# Patient Record
Sex: Male | Born: 1985 | Race: White | Hispanic: No | Marital: Single | Smoking: Never smoker
Health system: Southern US, Community
[De-identification: ages and names within clinical notes are randomized; demographics above are authoritative.]

## PROBLEM LIST (undated history)

## (undated) DIAGNOSIS — L309 Dermatitis, unspecified: Secondary | ICD-10-CM

## (undated) DIAGNOSIS — R000 Tachycardia, unspecified: Secondary | ICD-10-CM

## (undated) DIAGNOSIS — J45909 Unspecified asthma, uncomplicated: Secondary | ICD-10-CM

## (undated) DIAGNOSIS — F32A Depression, unspecified: Secondary | ICD-10-CM

## (undated) DIAGNOSIS — I10 Essential (primary) hypertension: Secondary | ICD-10-CM

## (undated) DIAGNOSIS — T7840XA Allergy, unspecified, initial encounter: Secondary | ICD-10-CM

## (undated) DIAGNOSIS — F840 Autistic disorder: Secondary | ICD-10-CM

## (undated) DIAGNOSIS — Z8619 Personal history of other infectious and parasitic diseases: Secondary | ICD-10-CM

## (undated) DIAGNOSIS — F329 Major depressive disorder, single episode, unspecified: Secondary | ICD-10-CM

## (undated) DIAGNOSIS — Z808 Family history of malignant neoplasm of other organs or systems: Secondary | ICD-10-CM

## (undated) HISTORY — DX: Major depressive disorder, single episode, unspecified: F32.9

## (undated) HISTORY — DX: Allergy, unspecified, initial encounter: T78.40XA

## (undated) HISTORY — PX: EXTERNAL EAR SURGERY: SHX627

## (undated) HISTORY — DX: Unspecified asthma, uncomplicated: J45.909

## (undated) HISTORY — PX: TYMPANOSTOMY TUBE PLACEMENT: SHX32

## (undated) HISTORY — DX: Dermatitis, unspecified: L30.9

## (undated) HISTORY — DX: Essential (primary) hypertension: I10

## (undated) HISTORY — DX: Depression, unspecified: F32.A

## (undated) HISTORY — DX: Personal history of other infectious and parasitic diseases: Z86.19

## (undated) HISTORY — PX: TONSILLECTOMY AND ADENOIDECTOMY: SHX28

---
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
## 2018-01-29 LAB — CBC AND DIFFERENTIAL
HCT: 50 (ref 41–53)
Hemoglobin: 16.7 (ref 13.5–17.5)
Neutrophils Absolute: 5
Platelets: 230 (ref 150–399)
WBC: 7.2

## 2018-01-29 LAB — LIPID PANEL
CHOLESTEROL: 201 — AB (ref 0–200)
HDL: 39 (ref 35–70)
LDL Cholesterol: 148
Triglycerides: 71 (ref 40–160)

## 2018-01-29 LAB — HEPATIC FUNCTION PANEL: ALT: 17 (ref 10–40)

## 2018-01-29 LAB — BASIC METABOLIC PANEL
BUN: 19 (ref 4–21)
Creatinine: 1.3 (ref 0.6–1.3)
Glucose: 85
Potassium: 4 (ref 3.4–5.3)
Sodium: 136 — AB (ref 137–147)

## 2018-01-29 LAB — HIV ANTIBODY (ROUTINE TESTING W REFLEX): HIV 1&2 Ab, 4th Generation: NEGATIVE

## 2018-07-07 LAB — PROTIME-INR: Protime: 21.9 — AB (ref 10.0–13.8)

## 2018-07-08 ENCOUNTER — Telehealth: Payer: Self-pay | Admitting: *Deleted

## 2018-07-08 NOTE — Telephone Encounter (Signed)
Pt is scheduled//ELEA  

## 2018-07-08 NOTE — Telephone Encounter (Signed)
Please schedule as an establish care visit. Thanks.

## 2018-07-08 NOTE — Telephone Encounter (Signed)
Please below and advise  Copied from [name] #[id number]. Topic: Appointment Scheduling - Scheduling Inquiry for Clinic >> Jul 08, 2018  8:41 AM Margot Ables wrote: Reason for CRM: [name] with Dr. Jonni Sanger, called stating that he has discussed with her having Taylor Hines seen by Dr. Jonni Sanger. He is autistic and recently had a surgery and on coumadin. Pt has allergy to aspirin. [name] and his wife will be bringing pt back home over the weekend. They have to make the drive over several days as Taylor Hines doesn't always travel well. [name] is requesting appt with Dr. Jonni Sanger on Tuesday afternoon 07/14/2018 so Taylor Hines can have coumadin level checked. He can do that as appt to est care or agreeable to coming another day for appt to est care if needed. Please call Brad to advise of scheduling.  Pt has [employer] primary, M[employer] secondary, and Medicaid tertiary insurances.

## 2018-07-08 NOTE — Telephone Encounter (Signed)
Ladies Dr. Jonni Sanger has agreed to take pts son as new pt. I dont mind calling to schedule but not sure what is needed when scheduling a new pt. Please lmk what to do and i'll schedule or if one of you are able to schedule that is fine as well.

## 2018-07-14 ENCOUNTER — Ambulatory Visit: Payer: 59 | Admitting: Family Medicine

## 2018-07-14 ENCOUNTER — Ambulatory Visit: Payer: 59 | Admitting: Physician Assistant

## 2018-07-14 ENCOUNTER — Other Ambulatory Visit: Payer: Self-pay

## 2018-07-14 ENCOUNTER — Encounter: Payer: Self-pay | Admitting: Physician Assistant

## 2018-07-14 VITALS — BP 118/80 | HR 109 | Temp 98.2°F | Resp 16

## 2018-07-14 DIAGNOSIS — Z9229 Personal history of other drug therapy: Secondary | ICD-10-CM

## 2018-07-14 DIAGNOSIS — S82302D Unspecified fracture of lower end of left tibia, subsequent encounter for closed fracture with routine healing: Secondary | ICD-10-CM | POA: Diagnosis not present

## 2018-07-14 DIAGNOSIS — S82832D Other fracture of upper and lower end of left fibula, subsequent encounter for closed fracture with routine healing: Secondary | ICD-10-CM

## 2018-07-14 LAB — POCT INR: INR: 2.2 (ref 2.0–3.0)

## 2018-07-14 NOTE — Patient Instructions (Signed)
Please stop the coumadin since we are past the 4 week mark. We are in contact with DR. Vorlicky, awaiting a callback so we can get you set up for physical therapy ASAP. I will call you when we know more!  Continue other medications as directed.  It was very nice meeting your today!

## 2018-07-14 NOTE — Progress Notes (Signed)
Patient with history of high-function autism presents to clinic today with mother to establish care. Patient lives in [place] but recently experienced left tibial and distal fibular fractures in January after falling on a patch of ice. Underwent surgical repeat in mid January in [place]. Patient recently got out of skilled rehabilitation facility and is having to stay with parents here in [place] until her can get on his feet again. Patient is currently in a orthopedic boot. Is non full weight-bearing on this extremity for the next 4 weeks. Was placed on a regimen of Coumadin post-surgery to reduce risk of clotting. Mother notes he is overdue for a coumadin check. Was to complete 4 week course which ends in a couple of days. Pain is well-controlled with Tylenol per patient. Is eating and hydrating well.   Past Medical History:  Diagnosis Date  . Allergy   . Asthma   . Depression   . History of chickenpox   . Hypertension     Past Surgical History:  Procedure Laterality Date  . EXTERNAL EAR SURGERY    . TONSILLECTOMY AND ADENOIDECTOMY    . TYMPANOSTOMY TUBE PLACEMENT      Current Outpatient Medications on File Prior to Visit  Medication Sig Dispense Refill  . acetaminophen (TYLENOL) 500 MG tablet Take 500 mg by mouth 2 (two) times daily.    Marland Kitchen albuterol (PROVENTIL HFA;VENTOLIN HFA) 108 (90 Base) MCG/ACT inhaler Inhale 2 puffs into the lungs every 6 (six) hours as needed for wheezing or shortness of breath.    . beclomethasone (QVAR) 40 MCG/ACT inhaler Inhale 1 puff into the lungs 2 (two) times daily.    . citalopram (CELEXA) 10 MG tablet Take 10 mg by mouth daily.    . fluticasone (FLONASE) 50 MCG/ACT nasal spray Place 2 sprays into both nostrils daily.    Marland Kitchen gabapentin (NEURONTIN) 300 MG capsule Take 300 mg by mouth 2 (two) times daily.    Marland Kitchen lisinopril (PRINIVIL,ZESTRIL) 20 MG tablet Take 20 mg by mouth daily.    . methylphenidate (RITALIN LA) 20 MG 24 hr capsule Take 60 mg by mouth  every morning.    . Multiple Vitamin (MULTIVITAMIN) tablet Take 1 tablet by mouth daily.    . Probiotic Product (CULTURELLE PROBIOTICS PO) Take 2 capsules by mouth daily.    Marland Kitchen warfarin (COUMADIN) 3 MG tablet Take 3 mg by mouth. Every day except on Wed    . warfarin (COUMADIN) 4 MG tablet Take 4 mg by mouth. On Wed only     No current facility-administered medications on file prior to visit.    Allergies  Allergen Reactions  . Bactrim [Sulfamethoxazole-Trimethoprim] Hives and Swelling  . Nsaids Swelling    Family History  Problem Relation Age of Onset  . Asthma Mother   . Diabetes Mother        Prediabetes  . Hyperlipidemia Mother   . Cancer Father        Skin  . Depression Father   . Hearing loss Father   . Hypertension Father   . Asthma Brother   . Depression Brother   . Arthritis Maternal Grandmother   . Diabetes Maternal Grandmother   . Hearing loss Maternal Grandmother   . Hyperlipidemia Maternal Grandmother   . Hypertension Maternal Grandmother   . Miscarriages / Stillbirths Maternal Grandmother   . Alcohol abuse Maternal Grandfather   . Cancer Maternal Grandfather        Prostate  . Cancer Paternal Grandmother  Breast  . Hearing loss Paternal Grandmother   . Miscarriages / Stillbirths Paternal Grandmother   . Cancer Paternal Grandfather        Skin  . Bipolar disorder Paternal Grandfather   . Asthma Brother   . Depression Brother     Social History   Socioeconomic History  . Marital status: Single    Spouse name: Not on file  . Number of children: Not on file  . Years of education: Not on file  . Highest education level: Not on file  Occupational History  . Not on file  Social Needs  . Financial resource strain: Not on file  . Food insecurity:    Worry: Not on file    Inability: Not on file  . Transportation needs:    Medical: Not on file    Non-medical: Not on file  Tobacco Use  . Smoking status: Never Smoker  . Smokeless tobacco: Never  Used  Substance and Sexual Activity  . Alcohol use: Yes    Frequency: Never  . Drug use: Never  . Sexual activity: Never  Lifestyle  . Physical activity:    Days per week: Not on file    Minutes per session: Not on file  . Stress: Not on file  Relationships  . Social connections:    Talks on phone: Not on file    Gets together: Not on file    Attends religious service: Not on file    Active member of club or organization: Not on file    Attends meetings of clubs or organizations: Not on file    Relationship status: Not on file  . Intimate partner violence:    Fear of current or ex partner: Not on file    Emotionally abused: Not on file    Physically abused: Not on file    Forced sexual activity: Not on file  Other Topics Concern  . Not on file  Social History Narrative  . Not on file   Review of Systems  Constitutional: Negative for fever and weight loss.  HENT: Negative for ear discharge, ear pain, hearing loss and tinnitus.   Eyes: Negative for blurred vision, double vision, photophobia and pain.  Respiratory: Negative for cough and shortness of breath.   Cardiovascular: Negative for chest pain and palpitations.  Gastrointestinal: Negative for abdominal pain, blood in stool, constipation, diarrhea, heartburn, melena, nausea and vomiting.  Genitourinary: Negative for dysuria, flank pain, frequency, hematuria and urgency.  Musculoskeletal: Positive for falls and joint pain.  Neurological: Negative for dizziness, loss of consciousness and headaches.  Endo/Heme/Allergies: Negative for environmental allergies.  Psychiatric/Behavioral: Negative for depression, hallucinations, substance abuse and suicidal ideas. The patient is not nervous/anxious and does not have insomnia.    BP 118/80   Pulse (!) 109   Temp 98.2 F (36.8 C) (Oral)   Resp 16   SpO2 99%   Physical Exam Constitutional:      Appearance: Normal appearance.  HENT:     Head: Normocephalic and atraumatic.    Eyes:     Conjunctiva/sclera: Conjunctivae normal.     Pupils: Pupils are equal, round, and reactive to light.  Neck:     Musculoskeletal: Neck supple.  Cardiovascular:     Rate and Rhythm: Normal rate and regular rhythm.     Pulses: Normal pulses.     Heart sounds: Normal heart sounds.  Pulmonary:     Effort: Pulmonary effort is normal.     Breath sounds: Normal breath  sounds.  Musculoskeletal:     Comments: Left lower extremity is in boot. Removed for visualization while maintaining positioning. Wounds have all healed nicely. Extremity is neurovascularly intact.   Neurological:     General: No focal deficit present.     Mental Status: He is alert and oriented to person, place, and time.  Psychiatric:        Mood and Affect: Mood normal.     Assessment/Plan: 1. History of Coumadin therapy - POCT INR - stable. Finish course in the next 2 days. No need for further anticoagulation at present.   2. Closed fracture of distal end of left tibia with routine healing, unspecified fracture morphology, subsequent encounter S/p surgical repair with rod placement. Is to start PT here in [place]. Will obtain specific PT orders from Orthopedics Surgeon in [place] and get this set up. He has follow up scheduled with Surgeon. Tylenol for pain. Stopping Coumadin therapy this week as he will have completed treatment course.   3. Closed fracture of distal end of left fibula with routine healing, unspecified fracture morphology, subsequent encounter Will obtain specific PT orders from Orthopedics Surgeon in [place] and get this set up. He has follow up scheduled with Surgeon. Tylenol for pain. Stopping Coumadin therapy this week as he will have completed treatment course.    Leeanne Rio, PA-C

## 2018-07-16 ENCOUNTER — Telehealth: Payer: Self-pay

## 2018-07-16 DIAGNOSIS — S82202A Unspecified fracture of shaft of left tibia, initial encounter for closed fracture: Secondary | ICD-10-CM

## 2018-07-16 NOTE — Telephone Encounter (Signed)
Received therapy order from [facility] DX: status post intramedullary nailing of the left tibial fracture Order: PT Patient status: post -operative Rehabilitation services: Evaluate and treat patient as needed  ROM as tolerated at the knee. NWB in the LLE for total of 6 weeks post op. PT-S/P ankle fracture. Evaluate and treat, modalities PRN, AAROM, AROM, PROM, flexibility/stretching, home exercise program NWB x 6 weeks post op then begin progressive gait training Visits: up to 20  Orders completed by Lora Havens, PA

## 2018-07-16 NOTE — Telephone Encounter (Signed)
Copied from [place] [id number]. Topic: General - Other >> Jul 16, 2018  9:50 AM Carolyn Stare wrote:  Dad is calling to ask if Taylor Hines has reached out to the pt surgeon and per there conversation . Would like a call back

## 2018-07-16 NOTE — Addendum Note (Signed)
Addended by: Leonidas Romberg on: 07/16/2018 04:41 PM   Modules accepted: Orders

## 2018-07-16 NOTE — Telephone Encounter (Signed)
Ok to place referral for PT.   Please attach these orders in the referral.

## 2018-07-16 NOTE — Telephone Encounter (Signed)
Called Dr Martina Sinner office again to get PT orders set up. Was directed to [facility] Left another message for orders to be give ASAP.

## 2018-07-17 NOTE — Telephone Encounter (Signed)
Spoke to patient's dad. He is aware that referral has been placed.

## 2018-07-17 NOTE — Telephone Encounter (Signed)
Mom called about PT referral status/ please advise

## 2018-07-23 ENCOUNTER — Telehealth: Payer: Self-pay | Admitting: *Deleted

## 2018-07-23 NOTE — Telephone Encounter (Signed)
Copied from [place] [id number]. Topic: General - Other >> Jul 23, 2018  3:15 PM Ivar Drape wrote: Reason for CRM:   Tammy w/[facility] [phone] wanted to know 1) Want to know if there are any heart rate perimeters when he is exercising  2) Does the patient need to do weight baring with or without the boot

## 2018-07-24 NOTE — Telephone Encounter (Signed)
Caller name: Tammy w/ [facility] Call back number: [phone]    Reason for call:  Patient has an appointment Monday afternoon and would Physical Therapist would like to speak with nurse or PA prior to appointment, please advise

## 2018-07-24 NOTE — Telephone Encounter (Signed)
Spoke with Tammy from [facility].  Per Einar Pheasant, these orders were from the Orthopedic doctor in [place].  She is going to call them to see what they say.   She was given the information [place] [phone] Provider: Lora Havens, PA

## 2018-08-07 ENCOUNTER — Encounter: Payer: Self-pay | Admitting: Emergency Medicine

## 2018-08-12 ENCOUNTER — Other Ambulatory Visit: Payer: Self-pay

## 2018-08-12 ENCOUNTER — Encounter (HOSPITAL_BASED_OUTPATIENT_CLINIC_OR_DEPARTMENT_OTHER): Payer: Self-pay | Admitting: *Deleted

## 2018-08-12 ENCOUNTER — Emergency Department (HOSPITAL_BASED_OUTPATIENT_CLINIC_OR_DEPARTMENT_OTHER)
Admission: EM | Admit: 2018-08-12 | Discharge: 2018-08-12 | Disposition: A | Discharge status: Home / Self Care | Payer: PRIVATE HEALTH INSURANCE | Attending: Emergency Medicine | Admitting: Emergency Medicine

## 2018-08-12 DIAGNOSIS — F329 Major depressive disorder, single episode, unspecified: Secondary | ICD-10-CM | POA: Diagnosis not present

## 2018-08-12 DIAGNOSIS — E860 Dehydration: Secondary | ICD-10-CM | POA: Diagnosis not present

## 2018-08-12 DIAGNOSIS — J45909 Unspecified asthma, uncomplicated: Secondary | ICD-10-CM | POA: Insufficient documentation

## 2018-08-12 DIAGNOSIS — R55 Syncope and collapse: Secondary | ICD-10-CM | POA: Diagnosis present

## 2018-08-12 DIAGNOSIS — I10 Essential (primary) hypertension: Secondary | ICD-10-CM | POA: Insufficient documentation

## 2018-08-12 DIAGNOSIS — R000 Tachycardia, unspecified: Secondary | ICD-10-CM | POA: Insufficient documentation

## 2018-08-12 DIAGNOSIS — R42 Dizziness and giddiness: Secondary | ICD-10-CM

## 2018-08-12 DIAGNOSIS — Z79899 Other long term (current) drug therapy: Secondary | ICD-10-CM | POA: Diagnosis not present

## 2018-08-12 HISTORY — DX: Autistic disorder: F84.0

## 2018-08-12 HISTORY — DX: Tachycardia, unspecified: R00.0

## 2018-08-12 LAB — COMPREHENSIVE METABOLIC PANEL
ALT: 16 U/L (ref 0–44)
AST: 16 U/L (ref 15–41)
Albumin: 4.4 g/dL (ref 3.5–5.0)
Alkaline Phosphatase: 83 U/L (ref 38–126)
Anion gap: 7 (ref 5–15)
BUN: 18 mg/dL (ref 6–20)
CO2: 28 mmol/L (ref 22–32)
Calcium: 9.5 mg/dL (ref 8.9–10.3)
Chloride: 103 mmol/L (ref 98–111)
Creatinine, Ser: 1.04 mg/dL (ref 0.61–1.24)
GFR calc Af Amer: >60 mL/min (ref >60)
GFR calc non Af Amer: >60 mL/min (ref >60)
Glucose, Bld: 101 mg/dL — ABNORMAL HIGH (ref 70–99)
Potassium: 3.8 mmol/L (ref 3.5–5.1)
Sodium: 138 mmol/L (ref 135–145)
Total Bilirubin: 0.3 mg/dL (ref 0.3–1.2)
Total Protein: 7 g/dL (ref 6.5–8.1)

## 2018-08-12 LAB — CBC WITH DIFFERENTIAL/PLATELET
ABS IMMATURE GRANULOCYTES: 0.02 10*3/uL (ref 0.00–0.07)
Basophils Absolute: 0.1 10*3/uL (ref 0.0–0.1)
Basophils Relative: 1 %
Eosinophils Absolute: 0.3 10*3/uL (ref 0.0–0.5)
Eosinophils Relative: 3 %
HCT: 49.3 % (ref 39.0–52.0)
HEMOGLOBIN: 16 g/dL (ref 13.0–17.0)
Immature Granulocytes: 0 %
LYMPHS PCT: 18 %
Lymphs Abs: 1.7 10*3/uL (ref 0.7–4.0)
MCH: 30 pg (ref 26.0–34.0)
MCHC: 32.5 g/dL (ref 30.0–36.0)
MCV: 92.5 fL (ref 80.0–100.0)
Monocytes Absolute: 0.9 10*3/uL (ref 0.1–1.0)
Monocytes Relative: 9 %
NEUTROS ABS: 6.6 10*3/uL (ref 1.7–7.7)
Neutrophils Relative %: 69 %
Platelets: 281 10*3/uL (ref 150–400)
RBC: 5.33 MIL/uL (ref 4.22–5.81)
RDW: 13.6 % (ref 11.5–15.5)
WBC: 9.6 10*3/uL (ref 4.0–10.5)
nRBC: 0 % (ref 0.0–0.2)

## 2018-08-12 LAB — D-DIMER, QUANTITATIVE (NOT AT ARMC)

## 2018-08-12 LAB — TROPONIN I: Troponin I: <0.03 ng/mL (ref <0.03)

## 2018-08-12 LAB — MAGNESIUM: MAGNESIUM: 2 mg/dL (ref 1.7–2.4)

## 2018-08-12 MED ORDER — SODIUM CHLORIDE 0.9 % IV BOLUS
1000.0000 mL | Freq: Once | INTRAVENOUS | Status: AC
  Administered 2018-08-12: 1000 mL via INTRAVENOUS

## 2018-08-12 NOTE — ED Notes (Signed)
Pt was able to stand beside bed without any complaints of dizziness.

## 2018-08-12 NOTE — ED Provider Notes (Signed)
[facility] EMERGENCY DEPARTMENT Provider Note   CSN: [id number] Arrival date & time: 08/12/18  1605    History   Chief Complaint Chief Complaint  Patient presents with   Dizziness    HPI Taylor Hines is a 33 y.o. male.     The history is provided by the patient, a parent and medical records. No language interpreter was used.  Near Syncope  This is a new problem. The current episode started more than 2 days ago. The problem occurs daily. The problem has not changed since onset.Pertinent negatives include no chest pain, no abdominal pain, no headaches and no shortness of breath. The symptoms are aggravated by standing. The symptoms are relieved by lying down and rest. He has tried nothing for the symptoms. The treatment provided no relief.    Past Medical History:  Diagnosis Date   Allergy    Asthma    Depression    History of chickenpox    Hypertension    Tachycardia     There are no active problems to display for this patient.   Past Surgical History:  Procedure Laterality Date   EXTERNAL EAR SURGERY     TONSILLECTOMY AND ADENOIDECTOMY     TYMPANOSTOMY TUBE PLACEMENT          Home Medications    Prior to Admission medications   Medication Sig Start Date End Date Taking? Authorizing Provider  acetaminophen (TYLENOL) 500 MG tablet Take 500 mg by mouth 2 (two) times daily.    [provider]  albuterol (PROVENTIL HFA;VENTOLIN HFA) 108 (90 Base) MCG/ACT inhaler Inhale 2 puffs into the lungs every 6 (six) hours as needed for wheezing or shortness of breath.    [provider]  beclomethasone (QVAR) 40 MCG/ACT inhaler Inhale 1 puff into the lungs 2 (two) times daily.    [provider]  citalopram (CELEXA) 10 MG tablet Take 10 mg by mouth daily.    [provider]  fluticasone (FLONASE) 50 MCG/ACT nasal spray Place 2 sprays into both nostrils daily.    [provider]  gabapentin (NEURONTIN)  300 MG capsule Take 300 mg by mouth 2 (two) times daily.    [provider]  lisinopril (PRINIVIL,ZESTRIL) 20 MG tablet Take 20 mg by mouth daily.    [provider]  methylphenidate (RITALIN LA) 20 MG 24 hr capsule Take 60 mg by mouth every morning.    [provider]  Multiple Vitamin (MULTIVITAMIN) tablet Take 1 tablet by mouth daily.    [provider]  Probiotic Product (CULTURELLE PROBIOTICS PO) Take 2 capsules by mouth daily.    [provider]    Family History Family History  Problem Relation Age of Onset   Asthma Mother    Diabetes Mother        Prediabetes   Hyperlipidemia Mother    Cancer Father        Skin   Depression Father    Hearing loss Father    Hypertension Father    Asthma Brother    Depression Brother    Arthritis Maternal Grandmother    Diabetes Maternal Grandmother    Hearing loss Maternal Grandmother    Hyperlipidemia Maternal Grandmother    Hypertension Maternal Grandmother    Miscarriages / Stillbirths Maternal Grandmother    Alcohol abuse Maternal Grandfather    Cancer Maternal Grandfather        Prostate   Cancer Paternal Grandmother  Breast   Hearing loss Paternal Grandmother    Miscarriages / Stillbirths Paternal Grandmother    Cancer Paternal Grandfather        Skin   Bipolar disorder Paternal Grandfather    Asthma Brother    Depression Brother     Social History Social History   Tobacco Use   Smoking status: Never Smoker   Smokeless tobacco: Never Used  Substance Use Topics   Alcohol use: Yes    Frequency: Never   Drug use: Never     Allergies   Bactrim [sulfamethoxazole-trimethoprim] and Nsaids   Review of Systems Review of Systems  Constitutional: Negative for chills, diaphoresis, fatigue and fever.  HENT: Negative for congestion.   Eyes: Negative for visual disturbance.  Respiratory: Negative for cough, chest tightness, shortness of breath  and wheezing.   Cardiovascular: Positive for palpitations and near-syncope. Negative for chest pain and leg swelling.  Gastrointestinal: Negative for abdominal pain, constipation, diarrhea, nausea and vomiting.  Genitourinary: Negative for flank pain and frequency.  Musculoskeletal: Negative for back pain, neck pain and neck stiffness.  Neurological: Positive for light-headedness. Negative for dizziness, seizures, speech difficulty, weakness, numbness and headaches.  Psychiatric/Behavioral: Negative for agitation and confusion.  All other systems reviewed and are negative.    Physical Exam Updated Vital Signs BP 138/82    Pulse (!) 136    Temp (!) 97.4 F (36.3 C)    Resp 18    SpO2 98%   Physical Exam Vitals signs and nursing note reviewed.  Constitutional:      General: He is not in acute distress.    Appearance: He is well-developed. He is not ill-appearing, toxic-appearing or diaphoretic.  HENT:     Head: Normocephalic and atraumatic.     Nose: Nose normal. No congestion or rhinorrhea.     Mouth/Throat:     Mouth: Mucous membranes are dry.     Pharynx: No oropharyngeal exudate or posterior oropharyngeal erythema.  Eyes:     Conjunctiva/sclera: Conjunctivae normal.     Pupils: Pupils are equal, round, and reactive to light.  Neck:     Musculoskeletal: Neck supple.  Cardiovascular:     Rate and Rhythm: Regular rhythm. Tachycardia present.     Pulses: Normal pulses.     Heart sounds: Normal heart sounds. No murmur.  Pulmonary:     Effort: Pulmonary effort is normal. No respiratory distress.     Breath sounds: Normal breath sounds. No wheezing, rhonchi or rales.  Chest:     Chest wall: No tenderness.  Abdominal:     Palpations: Abdomen is soft.     Tenderness: There is no abdominal tenderness. There is no right CVA tenderness, left CVA tenderness, guarding or rebound.  Musculoskeletal:        General: Tenderness (L knee tenderness over surgical site) present.     Right  lower leg: No edema.     Left lower leg: No edema.  Skin:    General: Skin is warm and dry.     Capillary Refill: Capillary refill takes less than 2 seconds.     Findings: No erythema.  Neurological:     General: No focal deficit present.     Mental Status: He is alert.      ED Treatments / Results  Labs (all labs ordered are listed, but only abnormal results are displayed) Labs Reviewed  COMPREHENSIVE METABOLIC PANEL - Abnormal; Notable for the following components:      Result Value  Glucose, Bld 101 (*)    All other components within normal limits  CBC WITH DIFFERENTIAL/PLATELET  TROPONIN I  MAGNESIUM  D-DIMER, QUANTITATIVE (NOT AT [facility])    EKG EKG Interpretation  Date/Time:  Wednesday August 12 2018 16:26:31 EDT Ventricular Rate:  109 PR Interval:    QRS Duration: 94 QT Interval:  329 QTC Calculation: 443 R Axis:   61 Text Interpretation:  Sinus tachycardia RSR' in V1 or V2, probably normal variant ST elev, probable normal early repol pattern Artifact in lead(s) I III aVL V1 V2 No prior ECG for comparison.  No STEMI Confirmed by Antony Blackbird [phone]) on 08/12/2018 4:39:48 PM   Radiology No results found.  Procedures Procedures (including critical care time)  Medications Ordered in ED Medications  sodium chloride 0.9 % bolus 1,000 mL (0 mLs Intravenous Stopped 08/12/18 1817)     Initial Impression / Assessment and Plan / ED Course  I have reviewed the triage vital signs and the nursing notes.  Pertinent labs & imaging results that were available during my care of the patient were reviewed by me and considered in my medical decision making (see chart for details).        Taylor Hines is a 33 y.o. male with a past medical history significant for hypertension, autism, asthma, and prior tachycardia who presents with near syncopal episodes, lightheadedness with standing, and tachycardia while at rehab.  Patient reports that he had surgery last month  on his left leg.  He has no history of DVT or PE and has not had worsened leg pain or leg swelling however, while at rehabilitation today, patient was found to have a tachycardia that was too fast to palpate in the setting of near syncope and lightheadedness.  The symptoms are the emergency department to rule out thrombo-embolic etiology of tachycardia and lightheadedness.  Patient denies any chest pain or shortness of breath.  He reports he was feeling his heart racing.  He reports her last 3 days he has had several episodes of lightheadedness and near syncope in the setting of standing up.  He denies any symptoms at rest.  He continues to deny any fevers, chills, ingestion, or cough.  He denies any urinary or GI symptoms although he does report last month while in rehab he had significant diarrhea.  He does say his mouth feels dry but he is trying to drink fluids.  He denies recent injuries.  Denies other complaints.  On exam, patient has well-appearing surgical scars on his left knee and left shin.  No significant calf or lower leg tenderness or swelling.  Normal sensation and pulse in lower extremities.  Normal appearance of the skin.  Chest nontender and abdomen nontender.  Lungs are clear.  Patient has a sinus tachycardia that is between 101 20 on my initial evaluation.  Patient otherwise in no distress and continues to deny chest pain or shortness of breath.  Given his mother's report that patient has chronic tachycardia, I think patient's recent diarrhea and clinical dehydration is contributing to his near syncope and likely orthostasis.  Patient be given fluids but he will also have screening laboratory testing to look for electrolyte imbalance, further dehydration, and also a d-dimer to look for DVT/PE.  If d-dimer is positive, will likely obtain CT PE study and lower extremity ultrasounds.  If dimer is negative, will hold on further imaging.  Anticipate reassessment after work-up.  6:52  PM Patient's laboratory testing was reassuring.  D-dimer was negative.  Troponin was negative.  Patient remained slightly tachycardic but improved from the 120 range down to the 105 range.  Family reports this is where he normally runs.  Patient felt much better after fluids and no longer felt dehydrated.  He was able to stand without any lightheadedness or near syncope.  Given his reassuring work-up and his resolution of near syncope/lightheadedness after fluids, we feel he is safe for discharge home.  Patient and family to follow-up with PCP and understood strict return precautions.  He had no other questions or concerns and was discharged in good condition.   Final Clinical Impressions(s) / ED Diagnoses   Final diagnoses:  Dehydration  Lightheadedness  Near syncope  Tachycardia    ED Discharge Orders    None     Clinical Impression: 1. Dehydration   2. Lightheadedness   3. Near syncope   4. Tachycardia     Disposition: Discharge  Condition: Good  I have discussed the results, Dx and Tx plan with the pt(& family if present). He/she/they expressed understanding and agree(s) with the plan. Discharge instructions discussed at great length. Strict return precautions discussed and pt &/or family have verbalized understanding of the instructions. No further questions at time of discharge.    New Prescriptions   No medications on file    Follow Up: Delorse Limber [address] [place] [address] [phone]     [facility] [address] [id number] [place] [phone]       Uriel Horkey, Gwenyth Allegra, MD 08/12/18 [phone]

## 2018-08-12 NOTE — Discharge Instructions (Signed)
Your work-up today showed no evidence of blood clot, your EKG was reassuring, and your other laboratory testing was normal.  I suspect the diarrhea he had recently caused him to be slightly dehydrated and this was reflected in how he felt with lightheadedness and near syncope after standing.  As his symptoms have resolved after fluids, we feel he is safe for discharge home.  Please follow-up with his PCP and continue his outpatient rehab regimen.  Please push fluids for the next week.  If any symptoms change or worsen, please return to the nearest emergency department.

## 2018-08-12 NOTE — ED Triage Notes (Signed)
Pt co dizziness during  physical therapy x 2 episodes

## 2018-08-12 NOTE — ED Notes (Signed)
Per mom, pt has hx of "tachycardia where his heartrate would go as high as 220 and low as 20 when he was a baby"

## 2018-11-02 DIAGNOSIS — M5416 Radiculopathy, lumbar region: Secondary | ICD-10-CM | POA: Diagnosis not present

## 2018-11-02 DIAGNOSIS — M9902 Segmental and somatic dysfunction of thoracic region: Secondary | ICD-10-CM | POA: Diagnosis not present

## 2018-11-02 DIAGNOSIS — M9901 Segmental and somatic dysfunction of cervical region: Secondary | ICD-10-CM | POA: Diagnosis not present

## 2018-11-02 DIAGNOSIS — M5414 Radiculopathy, thoracic region: Secondary | ICD-10-CM | POA: Diagnosis not present

## 2018-11-02 DIAGNOSIS — M9903 Segmental and somatic dysfunction of lumbar region: Secondary | ICD-10-CM | POA: Diagnosis not present

## 2018-11-02 DIAGNOSIS — M531 Cervicobrachial syndrome: Secondary | ICD-10-CM | POA: Diagnosis not present

## 2018-12-01 DIAGNOSIS — F411 Generalized anxiety disorder: Secondary | ICD-10-CM | POA: Diagnosis not present

## 2018-12-01 DIAGNOSIS — F900 Attention-deficit hyperactivity disorder, predominantly inattentive type: Secondary | ICD-10-CM | POA: Diagnosis not present

## 2018-12-30 DIAGNOSIS — D225 Melanocytic nevi of trunk: Secondary | ICD-10-CM | POA: Diagnosis not present

## 2018-12-30 DIAGNOSIS — D229 Melanocytic nevi, unspecified: Secondary | ICD-10-CM | POA: Diagnosis not present

## 2018-12-30 DIAGNOSIS — D485 Neoplasm of uncertain behavior of skin: Secondary | ICD-10-CM | POA: Diagnosis not present

## 2018-12-30 HISTORY — DX: Melanocytic nevi, unspecified: D22.9

## 2019-01-05 DIAGNOSIS — Z20828 Contact with and (suspected) exposure to other viral communicable diseases: Secondary | ICD-10-CM | POA: Diagnosis not present

## 2019-01-15 ENCOUNTER — Ambulatory Visit (HOSPITAL_COMMUNITY)
Admission: EM | Admit: 2019-01-15 | Discharge: 2019-01-15 | Disposition: A | Discharge status: Home / Self Care | Payer: PPO

## 2019-01-15 ENCOUNTER — Other Ambulatory Visit: Payer: Self-pay

## 2019-01-15 DIAGNOSIS — M7918 Myalgia, other site: Secondary | ICD-10-CM | POA: Diagnosis not present
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
## 2019-01-15 NOTE — Discharge Instructions (Addendum)
Take acetaminophen or ibuprofen as needed for discomfort.    Go to the emergency department if you develop worsening pain, dizziness, weakness, chest pain, shortness of breath, abdominal pain, or other concerning symptoms.

## 2019-01-26 ENCOUNTER — Other Ambulatory Visit: Payer: Self-pay

## 2019-01-26 ENCOUNTER — Telehealth: Payer: Self-pay | Admitting: Physician Assistant

## 2019-01-26 ENCOUNTER — Encounter: Payer: Self-pay | Admitting: Physician Assistant

## 2019-01-26 ENCOUNTER — Ambulatory Visit (INDEPENDENT_AMBULATORY_CARE_PROVIDER_SITE_OTHER): Payer: PPO | Admitting: Physician Assistant

## 2019-01-26 VITALS — BP 118/80 | HR 98 | Temp 98.3°F | Resp 16 | Ht 72.0 in | Wt 204.0 lb

## 2019-01-26 DIAGNOSIS — L03011 Cellulitis of right finger: Secondary | ICD-10-CM | POA: Diagnosis not present

## 2019-01-26 MED ORDER — MUPIROCIN 2 % EX OINT: 1.0000 "application " | TOPICAL_OINTMENT | Freq: Two times a day (BID) | CUTANEOUS | 0 refills | Status: DC

## 2019-01-26 MED ORDER — CEPHALEXIN 500 MG PO CAPS: 500.0000 mg | ORAL_CAPSULE | Freq: Two times a day (BID) | ORAL | 0 refills | Status: DC

## 2019-01-26 NOTE — Telephone Encounter (Signed)
Please advise if transfer of care is approved or denied before patient is contacted to schedule.   Copied from [place] [id number]. Topic: Appointment Scheduling - Transfer of Care >> Jan 26, 2019  9:07 AM Rayann Heman wrote: Pt is requesting to transfer FROM: Elyn Aquas  Pt is requesting to transfer TO: Dr Jonni Sanger  Reason for requested transfer: had Dr Jonni Sanger before   Send CRM to patient's current PCP (transferring FROM).

## 2019-01-26 NOTE — Patient Instructions (Signed)
Please soak the finger for 5-10 minutes twice daily in a small basin full of warm water and hydrogen peroxide 3:1 ratio (water to peroxide). Pat dry and apply the antibiotic ointment to the area.  Take the oral antibiotic as directed.  Symptoms should improve and resolve. If anything note improving or anything worsen, please let us know.    Paronychia Paronychia is an infection of the skin. It happens near a fingernail or toenail. It may cause pain and swelling around the nail. In some cases, a fluid-filled bump (abscess) can form near or under the nail. Usually, this condition is not serious, and it clears up with treatment. Follow these instructions at home: Wound care  Keep the affected area clean.  Soak the fingers or toes in warm water as told by your doctor. You may be told to do this for 20 minutes, 2-3 times a day.  Keep the area dry when you are not soaking it.  Do not try to drain a fluid-filled bump on your own.  Follow instructions from your doctor about how to take care of the affected area. Make sure you: ? Wash your hands with soap and water before you change your bandage (dressing). If you cannot use soap and water, use hand sanitizer. ? Change your bandage as told by your doctor.  If you had a fluid-filled bump and your doctor drained it, check the area every day for signs of infection. Check for: ? Redness, swelling, or pain. ? Fluid or blood. ? Warmth. ? Pus or a bad smell. Medicines   Take over-the-counter and prescription medicines only as told by your doctor.  If you were prescribed an antibiotic medicine, take it as told by your doctor. Do not stop taking it even if you start to feel better. General instructions  Avoid touching any chemicals.  Do not pick at the affected area. Prevention  To prevent this condition from happening again: ? Wear rubber gloves when putting your hands in water for washing dishes or other tasks. ? Wear gloves if your  hands might touch cleaners or chemicals. ? Avoid injuring your nails or fingertips. ? Do not bite your nails or tear hangnails. ? Do not cut your nails very short. ? Do not cut the skin at the base and sides of the nail (cuticles). ? Use clean nail clippers or scissors when trimming nails. Contact a doctor if:  You feel worse.  You do not get better.  You have more fluid, blood, or pus coming from the affected area.  Your finger or knuckle is swollen or is hard to move. Get help right away if you have:  A fever or chills.  Redness spreading from the affected area.  Pain in a joint or muscle. Summary  Paronychia is an infection of the skin. It happens near a fingernail or toenail.  This condition may cause pain and swelling around the nail.  Soak the fingers or toes in warm water as told by your doctor.  Usually, this condition is not serious, and it clears up with treatment. This information is not intended to replace advice given to you by your health care provider. Make sure you discuss any questions you have with your health care provider. Document Released: 04/24/2009 Document Revised: 05/23/2017 Document Reviewed: 05/19/2017 Elsevier Patient Education  2020 [employer].

## 2019-01-26 NOTE — Progress Notes (Signed)
Patient presents to clinic today c/o 1.5 days of redness and tenderness around distal R middle finger. Notes some mild pus draining from cuticle this AM. Denies fever, chills, malaise or fatigue. Denies known trauma or injury to the area. Denies history of ingrown nails. Denies decreased ROM.   Past Medical History:  Diagnosis Date  . Allergy   . Asthma   . Autism   . Depression   . History of chickenpox   . Hypertension   . Tachycardia     Current Outpatient Medications on File Prior to Visit  Medication Sig Dispense Refill  . acetaminophen (TYLENOL) 500 MG tablet Take 500 mg by mouth 2 (two) times daily.    Marland Kitchen albuterol (PROVENTIL HFA;VENTOLIN HFA) 108 (90 Base) MCG/ACT inhaler Inhale 2 puffs into the lungs every 6 (six) hours as needed for wheezing or shortness of breath.    . beclomethasone (QVAR) 40 MCG/ACT inhaler Inhale 1 puff into the lungs 2 (two) times daily.    . citalopram (CELEXA) 10 MG tablet Take 10 mg by mouth daily.    . fluticasone (FLONASE) 50 MCG/ACT nasal spray Place 2 sprays into both nostrils daily.    Marland Kitchen gabapentin (NEURONTIN) 300 MG capsule Take 300 mg by mouth 2 (two) times daily.    Marland Kitchen lisinopril (PRINIVIL,ZESTRIL) 20 MG tablet Take 20 mg by mouth daily.    . methylphenidate (RITALIN LA) 20 MG 24 hr capsule Take 60 mg by mouth every morning.    . Multiple Vitamin (MULTIVITAMIN) tablet Take 1 tablet by mouth daily.    . Probiotic Product (CULTURELLE PROBIOTICS PO) Take 2 capsules by mouth daily.     No current facility-administered medications on file prior to visit.     Allergies  Allergen Reactions  . Bactrim [Sulfamethoxazole-Trimethoprim] Hives and Swelling  . Nsaids Swelling    Family History  Problem Relation Age of Onset  . Asthma Mother   . Diabetes Mother        Prediabetes  . Hyperlipidemia Mother   . Cancer Father        Skin  . Depression Father   . Hearing loss Father   . Hypertension Father   . Asthma Brother   . Depression  Brother   . Arthritis Maternal Grandmother   . Diabetes Maternal Grandmother   . Hearing loss Maternal Grandmother   . Hyperlipidemia Maternal Grandmother   . Hypertension Maternal Grandmother   . Miscarriages / Stillbirths Maternal Grandmother   . Alcohol abuse Maternal Grandfather   . Cancer Maternal Grandfather        Prostate  . Cancer Paternal Grandmother        Breast  . Hearing loss Paternal Grandmother   . Miscarriages / Stillbirths Paternal Grandmother   . Cancer Paternal Grandfather        Skin  . Bipolar disorder Paternal Grandfather   . Asthma Brother   . Depression Brother     Social History   Socioeconomic History  . Marital status: Single    Spouse name: Not on file  . Number of children: Not on file  . Years of education: Not on file  . Highest education level: Not on file  Occupational History  . Not on file  Social Needs  . Financial resource strain: Not on file  . Food insecurity    Worry: Not on file    Inability: Not on file  . Transportation needs    Medical: Not on file  Non-medical: Not on file  Tobacco Use  . Smoking status: Never Smoker  . Smokeless tobacco: Never Used  Substance and Sexual Activity  . Alcohol use: Yes    Frequency: Never  . Drug use: Never  . Sexual activity: Never  Lifestyle  . Physical activity    Days per week: Not on file    Minutes per session: Not on file  . Stress: Not on file  Relationships  . Social Herbalist on phone: Not on file    Gets together: Not on file    Attends religious service: Not on file    Active member of club or organization: Not on file    Attends meetings of clubs or organizations: Not on file    Relationship status: Not on file  Other Topics Concern  . Not on file  Social History Narrative  . Not on file   Review of Systems - See HPI.  All other ROS are negative.  BP 118/80   Pulse 98   Temp 98.3 F (36.8 C) (Skin)   Resp 16   Ht 6' (1.829 m)   Wt 204 lb (92.5  kg)   SpO2 99%   BMI 27.67 kg/m   Physical Exam Vitals signs reviewed.  Constitutional:      Appearance: Normal appearance.  Neck:     Musculoskeletal: Neck supple.  Cardiovascular:     Rate and Rhythm: Normal rate and regular rhythm.     Heart sounds: Normal heart sounds.  Musculoskeletal:       Hands:  Neurological:     Mental Status: He is alert.     Assessment/Plan: 1. Paronychia of right middle finger Start soaks twice daily. Handout given. Rx Mupirocin to apply topically BID. Rx Keflex as well giving patient concern of infection. No abscess present today for drainage. Strict return precautions reviewed.    Leeanne Rio, PA-C

## 2019-01-26 NOTE — Telephone Encounter (Signed)
Patient is scheduled for Oct. 9, no need to route note.

## 2019-01-26 NOTE — Telephone Encounter (Signed)
If he wants to transfer then that is fine with me.

## 2019-01-26 NOTE — Telephone Encounter (Signed)
That is ok with me. I think I see his father. Thanks.

## 2019-01-26 NOTE — Telephone Encounter (Signed)
Dr. Jonni Sanger I will await for your approval or denial of transfer. Elyn Aquas, PA-C has approved.

## 2019-02-17 ENCOUNTER — Telehealth: Payer: Self-pay | Admitting: Family Medicine

## 2019-02-17 NOTE — Telephone Encounter (Signed)
Copied from [place] [id number]. Topic: Appointment Scheduling - Scheduling Inquiry for Clinic >> Feb 17, 2019  1:26 PM Scherrie Gerlach wrote: Reason for CRM: dad states they have to go to [place] for a funeral and he cannot make the 10/09 appt, but would like it in a week after that. Nothing that I can schedule. Late in day is good. Please call if Dr Jonni Sanger approves an appt.  Please advise if Dr. Jonni Sanger will see TOC appt sooner than her scheduling allows, and if yes- where I can work pt in to get pt scheduled. Thank you!

## 2019-02-17 NOTE — Telephone Encounter (Signed)
Please advise 

## 2019-02-22 DIAGNOSIS — Z1159 Encounter for screening for other viral diseases: Secondary | ICD-10-CM | POA: Diagnosis not present

## 2019-02-26 ENCOUNTER — Encounter: Payer: Self-pay | Admitting: Family Medicine

## 2019-02-26 ENCOUNTER — Ambulatory Visit (INDEPENDENT_AMBULATORY_CARE_PROVIDER_SITE_OTHER): Payer: PPO | Admitting: Family Medicine

## 2019-02-26 ENCOUNTER — Other Ambulatory Visit: Payer: Self-pay

## 2019-02-26 VITALS — BP 100/80 | HR 103 | Temp 98.0°F | Ht 72.0 in | Wt 205.2 lb

## 2019-02-26 DIAGNOSIS — I10 Essential (primary) hypertension: Secondary | ICD-10-CM | POA: Diagnosis not present

## 2019-02-26 DIAGNOSIS — Z808 Family history of malignant neoplasm of other organs or systems: Secondary | ICD-10-CM

## 2019-02-26 DIAGNOSIS — Z0000 Encounter for general adult medical examination without abnormal findings: Secondary | ICD-10-CM | POA: Diagnosis not present

## 2019-02-26 DIAGNOSIS — J302 Other seasonal allergic rhinitis: Secondary | ICD-10-CM | POA: Insufficient documentation

## 2019-02-26 DIAGNOSIS — Z0001 Encounter for general adult medical examination with abnormal findings: Secondary | ICD-10-CM

## 2019-02-26 DIAGNOSIS — R1904 Left lower quadrant abdominal swelling, mass and lump: Secondary | ICD-10-CM | POA: Diagnosis not present

## 2019-02-26 DIAGNOSIS — J4530 Mild persistent asthma, uncomplicated: Secondary | ICD-10-CM | POA: Diagnosis not present

## 2019-02-26 DIAGNOSIS — J309 Allergic rhinitis, unspecified: Secondary | ICD-10-CM | POA: Insufficient documentation

## 2019-02-26 DIAGNOSIS — F339 Major depressive disorder, recurrent, unspecified: Secondary | ICD-10-CM | POA: Diagnosis not present

## 2019-02-26 DIAGNOSIS — F840 Autistic disorder: Secondary | ICD-10-CM

## 2019-02-26 HISTORY — DX: Essential (primary) hypertension: I10

## 2019-02-26 HISTORY — DX: Family history of malignant neoplasm of other organs or systems: Z80.8

## 2019-02-26 NOTE — Patient Instructions (Signed)
Please return in 6 months for follow up of your hypertension.  Please sign up for mychart.  I will release your lab results to you on your MyChart account with further instructions. Please reply with any questions.   We will call you with information regarding your referral appointment. An imaging study of your mass in your left lower abdominal wall.  If you do not hear from [place] within the next 2 weeks, please let me know. It can take 1-2 weeks to get appointments set up with the specialists.   It was a pleasure meeting you today! Thank you for choosing [facility] to meet your healthcare needs! I truly look forward to working with you. If you have any questions or concerns, please send me a message via Mychart or call the office at [phone].  Please do these things to maintain good health!   Exercise at least 30-45 minutes a day,  4-5 days a week.   Eat a low-fat diet with lots of fruits and vegetables, up to 7-9 servings per day.  Drink plenty of water daily. Try to drink 8 8oz glasses per day.  Seatbelts can save your life. Always wear your seatbelt.  Place Smoke Detectors on every level of your home and check batteries every year.  Eye Doctor - have an eye exam every 1-2 years  Safe sex - use condoms to protect yourself from STDs if you could be exposed to these types of infections.  Avoid heavy alcohol use. If you drink, keep it to less than 2 drinks/day and not every day.  [place].  Choose someone you trust that could speak for you if you became unable to speak for yourself.  Depression is common in our stressful world.If you're feeling down or losing interest in things you normally enjoy, please come in for a visit.

## 2019-02-26 NOTE — Progress Notes (Signed)
Subjective  Chief Complaint  Patient presents with  . Establish Care  . skin tag    Lt side of hair line and forehead    HPI: Taylor Hines is a 33 y.o. male who presents to [facility] [place] today for a Male Wellness Visit. He also has the concerns and/or needs as listed above in the chief complaint. These will be addressed in addition to the Health Maintenance Visit. TOC from [facility]. 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 Living with parents. Moved down from [place] this year. Thriving. HM: due for labs. imms up to date.  Lifestyle: Body mass index is 27.83 kg/m. Wt Readings from Last 3 Encounters:  02/26/19 205 lb 3.2 oz (93.1 kg)  01/26/19 204 lb (92.5 kg)     Chronic disease management visit and/or acute problem visit:  Depression: managed with celexa x 11 years. Well controlled. Gets more physical sxs when active like tics or shaking leg rather than low emotions.   Left abdominal wall mass x 2 years. 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 Nontender. Father worries given his h/o melanoma.   Derm: [facility] for annual exams; benign nodules on forehead: ? Dermatofibromas. Unchanged.   Allergies and allergic asthma. Does well with rare flare. Will get allergic urticaria as well.   HTN: well controlled on lisinopril. PMH with documented tachy and HR 103 today.   Patient Active Problem List   Diagnosis Date Noted  . High-functioning autism spectrum disorder 02/26/2019  . Essential hypertension 02/26/2019  . Mild persistent asthma 02/26/2019  . Chronic seasonal allergic rhinitis 02/26/2019  . Major depression, recurrent, chronic ([place]) 02/26/2019   Health Maintenance  Topic Date Due  . TETANUS/TDAP  06/24/2024  . INFLUENZA VACCINE  Completed  . HIV  Screening  Completed   Immunization History  Administered Date(s) Administered  . Hepatitis A 12/23/2008, 06/24/2014  . Influenza,inj,Quad PF,6+ Mos 03/20/2018, 01/17/2019  . Td 06/24/2014  . Typhoid Live 06/24/2014   We updated and reviewed the patient's past history in detail and it is documented below. Allergies: Patient is allergic to bactrim [sulfamethoxazole-trimethoprim] and nsaids. Past Medical History  has a past medical history of Allergy, Asthma, Autism, Depression, Essential hypertension (02/26/2019), History of chickenpox, Hypertension, and Tachycardia. Past Surgical History Patient  has a past surgical history that includes External ear surgery; Tonsillectomy and adenoidectomy; and Tympanostomy tube placement. Social History Patient  reports that he has never smoked. He has never used smokeless tobacco. He reports current alcohol use. He reports that he does not use drugs. Family History family history includes Alcohol abuse in his maternal grandfather; Arthritis in his maternal grandmother; Asthma in his brother, brother, and mother; Bipolar disorder in his paternal grandfather; Cancer in his father, maternal grandfather, paternal grandfather, and paternal grandmother; Depression in his brother, brother, and father; Diabetes in his maternal grandmother and mother; Hearing loss in his father, maternal grandmother, and paternal grandmother; Hyperlipidemia in his maternal grandmother and mother; Hypertension in his father and maternal grandmother; Miscarriages / [place] in his maternal grandmother and paternal grandmother. Review of Systems: Constitutional: negative for fever or malaise Ophthalmic: negative for photophobia, double vision or loss of vision Cardiovascular: negative for chest pain, dyspnea on exertion, or new LE swelling Respiratory: negative for SOB or persistent cough Gastrointestinal:  negative for abdominal pain, change in bowel habits or melena  Genitourinary: negative for dysuria or gross hematuria Musculoskeletal: negative for new gait disturbance or muscular weakness Integumentary: negative for new or persistent rashes Neurological: negative for TIA or stroke symptoms Psychiatric: negative for SI or delusions Allergic/Immunologic: negative for hives  Patient Care Team    Relationship Specialty Notifications Start End  Leamon Arnt, MD PCP - General Family Medicine  02/26/19    Objective  Vitals: BP 100/80 (BP Location: Left Arm, Patient Position: Sitting, Cuff Size: Normal)   Pulse (!) 103   Temp 98 F (36.7 C) (Temporal)   Ht 6' (1.829 m)   Wt 205 lb 3.2 oz (93.1 kg)   SpO2 94%   BMI 27.83 kg/m  General:  Well developed, well nourished, no acute distress  Psych:  Alert and orientedx3,normal mood and affect HEENT:  Normocephalic, atraumatic, non-icteric sclera, PERRL, oropharynx is clear without mass or exudate, supple neck without adenopathy, mass or thyromegaly Cardiovascular:  Normal S1, S2, RRR without gallop, rub or murmur, nondisplaced PMI, +2 distal pulses in bilateral upper and lower extremities. Respiratory:  Good breath sounds bilaterally, CTAB with normal respiratory effort Gastrointestinal: normal bowel sounds, soft, non-tender, left lower abdominal wall with approx 6x 10 cm lobular mass with smooth margins but firm. Nontender. No fluctuance. No HSM MSK: no deformities, contusions. Joints are without erythema or swelling. Spine and CVA region are nontender Skin:  Warm, no rashes or suspicious lesions noted, two smooth dome shaped small nodules on forehead Neurologic:    Mental status is normal. CN 2-11 are normal. Gross motor and sensory exams are normal. Stable gait. No tremor GU: No inguinal hernias or adenopathy are appreciated bilaterally   Assessment  1. Annual physical exam   2. High-functioning autism spectrum disorder   3. Essential hypertension   4. Mild persistent asthma without complication    5. Chronic seasonal allergic rhinitis   6. Major depression, recurrent, chronic ([place])   7. Left lower quadrant abdominal wall mass      Plan  Male Wellness Visit:  Age appropriate Health Maintenance and Prevention measures were discussed with patient. Included topics are cancer screening recommendations, ways to keep healthy (see AVS) including dietary and exercise recommendations, regular eye and dental care, use of seat belts, and avoidance of moderate alcohol use and tobacco use.   BMI: discussed patient's BMI and encouraged positive lifestyle modifications to help get to or maintain a target BMI.  HM needs and immunizations were addressed and ordered. See below for orders. See HM and immunization section for updates.  Routine labs and screening tests ordered including cmp, cbc and lipids where appropriate.  Discussed recommendations regarding Vit D and calcium supplementation (see AVS)  Chronic disease f/u and/or acute problem visit: (deemed necessary to be done in addition to the wellness visit):  abd wall mass: atypical for lipoma: ultrasound to better clarify.   HTN: controlled. Will consider changing to bb due to mild tachy. Check labs  Allergies and asthma are controlled. Rare albuterol use  Autism, high functioning. Living with parents.employed.  Follow up: 6 months for HTN recheck.    Commons side effects, risks, benefits, and alternatives for medications and treatment plan prescribed today were discussed, and the patient expressed understanding of the given instructions. Patient is instructed to call or message via MyChart if he/she has any questions or concerns regarding our treatment plan. No barriers to understanding were identified. We discussed Red Flag symptoms and signs  in detail. Patient expressed understanding regarding what to do in case of urgent or emergency type symptoms.   Medication list was reconciled, printed and provided to the patient in AVS. Patient  instructions and summary information was reviewed with the patient as documented in the AVS. This note was prepared with assistance of Dragon voice recognition software. Occasional wrong-word or sound-a-like substitutions may have occurred due to the inherent limitations of voice recognition software  Orders Placed This Encounter  Procedures  . US Abdomen Complete  . TSH  . Lipid panel  . Comprehensive metabolic panel  . CBC with Differential/Platelet   No orders of the defined types were placed in this encounter.

## 2019-02-27 LAB — CBC WITH DIFFERENTIAL/PLATELET
Absolute Monocytes: 598 cells/uL (ref 200–950)
Basophils Absolute: 72 cells/uL (ref 0–200)
Basophils Relative: 1 %
Eosinophils Absolute: 367 cells/uL (ref 15–500)
Eosinophils Relative: 5.1 %
HCT: 48.2 % (ref 38.5–50.0)
Hemoglobin: 16.8 g/dL (ref 13.2–17.1)
Lymphs Abs: 1642 cells/uL (ref 850–3900)
MCH: 31.5 pg (ref 27.0–33.0)
MCHC: 34.9 g/dL (ref 32.0–36.0)
MCV: 90.4 fL (ref 80.0–100.0)
MPV: 9.9 fL (ref 7.5–12.5)
Monocytes Relative: 8.3 %
Neutro Abs: 4522 cells/uL (ref 1500–7800)
Neutrophils Relative %: 62.8 %
Platelets: 261 10*3/uL (ref 140–400)
RBC: 5.33 10*6/uL (ref 4.20–5.80)
RDW: 12.2 % (ref 11.0–15.0)
Total Lymphocyte: 22.8 %
WBC: 7.2 10*3/uL (ref 3.8–10.8)

## 2019-02-27 LAB — LIPID PANEL
Cholesterol: 191 mg/dL (ref <200)
HDL: 46 mg/dL (ref >=40)
LDL Cholesterol (Calc): 117 mg/dL (calc) — ABNORMAL HIGH
Non-HDL Cholesterol (Calc): 145 mg/dL (calc) — ABNORMAL HIGH (ref <130)
Total CHOL/HDL Ratio: 4.2 (calc) (ref <5.0)
Triglycerides: 167 mg/dL — ABNORMAL HIGH (ref <150)

## 2019-02-27 LAB — COMPREHENSIVE METABOLIC PANEL
AG Ratio: 2.2 (calc) (ref 1.0–2.5)
ALT: 13 U/L (ref 9–46)
AST: 12 U/L (ref 10–40)
Albumin: 4.6 g/dL (ref 3.6–5.1)
Alkaline phosphatase (APISO): 72 U/L (ref 36–130)
BUN/Creatinine Ratio: 21 (calc) (ref 6–22)
BUN: 28 mg/dL — ABNORMAL HIGH (ref 7–25)
CO2: 26 mmol/L (ref 20–32)
Calcium: 10 mg/dL (ref 8.6–10.3)
Chloride: 104 mmol/L (ref 98–110)
Creat: 1.31 mg/dL (ref 0.60–1.35)
Globulin: 2.1 g/dL (calc) (ref 1.9–3.7)
Glucose, Bld: 98 mg/dL (ref 65–99)
Potassium: 4.5 mmol/L (ref 3.5–5.3)
Sodium: 139 mmol/L (ref 135–146)
Total Bilirubin: 0.3 mg/dL (ref 0.2–1.2)
Total Protein: 6.7 g/dL (ref 6.1–8.1)

## 2019-02-27 LAB — TSH: TSH: 0.91 mIU/L (ref 0.40–4.50)

## 2019-03-01 ENCOUNTER — Telehealth: Payer: Self-pay | Admitting: Family Medicine

## 2019-03-01 NOTE — Telephone Encounter (Signed)
Please contact to advise if call was made.   Copied from [place] [id number]. Topic: General - Call Back - No Documentation >> Mar 01, 2019 11:42 AM Sheran Luz wrote: Patient's father returning call to office. He states no voicemail was left. He is requesting a call back.    [phone]

## 2019-03-01 NOTE — Telephone Encounter (Signed)
LMOVM FOR BRAD TO RETURN CALL.Marland Kitchen  LM earlier that Rickey's labs were normal and no changes needed to be made.Madaline Brilliant for PEC to Discuss results / PCP recommendations

## 2019-03-01 NOTE — Progress Notes (Signed)
Please call patient: I have reviewed his/her lab results. Please pt or his father know that all blood test results are normal.

## 2019-03-02 DIAGNOSIS — F900 Attention-deficit hyperactivity disorder, predominantly inattentive type: Secondary | ICD-10-CM | POA: Diagnosis not present

## 2019-03-02 DIAGNOSIS — F411 Generalized anxiety disorder: Secondary | ICD-10-CM | POA: Diagnosis not present

## 2019-03-13 DIAGNOSIS — Z20828 Contact with and (suspected) exposure to other viral communicable diseases: Secondary | ICD-10-CM | POA: Diagnosis not present

## 2019-03-13 DIAGNOSIS — J45998 Other asthma: Secondary | ICD-10-CM | POA: Diagnosis not present

## 2019-03-13 DIAGNOSIS — R05 Cough: Secondary | ICD-10-CM | POA: Diagnosis not present

## 2019-03-15 ENCOUNTER — Telehealth: Payer: Self-pay | Admitting: Family Medicine

## 2019-03-15 NOTE — Telephone Encounter (Signed)
Patient's father calling. He states that he has viewed a "detected" covid-19 result via [facility]. He states patient was tested in office. I can find no documentation in Epic. He is requesting a call back to discuss. Please advise.

## 2019-03-15 NOTE — Telephone Encounter (Signed)
See note

## 2019-03-15 NOTE — Telephone Encounter (Signed)
Please call dad and see if you can field his questions; if not, send me the message to field them.  Should be able to answer questions w/o visit. Can schedule virtual visit for Wednesday IF he needs to be seen to address symptoms etc ..Marland Kitchen

## 2019-03-15 NOTE — Telephone Encounter (Signed)
Pt's father [name] is calling back - Audry Pili had a COVID test that came back as "detected."  He would like a call back with advice on what needs to be done to protect the family.

## 2019-03-15 NOTE — Telephone Encounter (Signed)
Doxy visit?   Nothing available until Wed, any recommendations or can he be double booked this afternoon

## 2019-03-15 NOTE — Telephone Encounter (Signed)
Patient's father called back in and wanting to discuss what the next steps should be and if he can email results to practice. Please advise.

## 2019-03-15 NOTE — Telephone Encounter (Signed)
Spoke with father, he wanted to verify the results: Detected - Abnormal; he is aware that those results mean positive for COVID. 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 He is aware to try and isolate from positive person(s), use symptom relieving medication, and go to ER if SOB

## 2019-03-22 ENCOUNTER — Other Ambulatory Visit: Payer: PPO

## 2019-03-29 ENCOUNTER — Ambulatory Visit
Admission: RE | Admit: 2019-03-29 | Discharge: 2019-03-29 | Disposition: A | Discharge status: Home / Self Care | Payer: PPO | Source: Ambulatory Visit | Attending: Family Medicine | Admitting: Family Medicine

## 2019-03-29 ENCOUNTER — Other Ambulatory Visit: Payer: Self-pay | Admitting: Family Medicine

## 2019-03-29 DIAGNOSIS — R1904 Left lower quadrant abdominal swelling, mass and lump: Secondary | ICD-10-CM | POA: Diagnosis not present

## 2019-03-29 NOTE — Progress Notes (Signed)
Please call patient: I have reviewed his/her lab results. Please speak with his father (pt has autism): ultrasound shows abdominal wall mass that needs further assessment with MRI to better clarify. I have placed the order.

## 2019-03-31 ENCOUNTER — Telehealth: Payer: Self-pay | Admitting: Family Medicine

## 2019-03-31 NOTE — Telephone Encounter (Signed)
See result note.  

## 2019-03-31 NOTE — Telephone Encounter (Signed)
Copied from [place] [id number]. Topic: Quick Communication - Other Results (Clinic Use ONLY) >> Mar 30, 2019  2:14 PM Marian Sorrow, LPN wrote: Called patient to inform them of Ultrasound results. When patient returns call, triage nurse may disclose results. >> Mar 31, 2019  8:30 AM Keene Breath wrote: Patient's father is calling back regarding test results.  Tried NT but they were busy on another call.  Please call patient's father back with results at [phone]

## 2019-04-02 ENCOUNTER — Telehealth: Payer: Self-pay | Admitting: Physical Therapy

## 2019-04-02 DIAGNOSIS — R1900 Intra-abdominal and pelvic swelling, mass and lump, unspecified site: Secondary | ICD-10-CM

## 2019-04-02 NOTE — Telephone Encounter (Signed)
Copied from [place] [id number]. Topic: General - Inquiry >> Apr 02, 2019 12:31 PM Oneta Rack wrote: Mr. Roebke states he received a missed call and no message was left. Father inquiring if MRI / CT scan orders have been placed. Father was unsure which order was going to be placed MRI or CT . Please leave detail message on father phone.

## 2019-04-02 NOTE — Telephone Encounter (Signed)
Spoke to pt's father [name] told him MRI was to be ordered. He said he missed a call but not sure who from and he called the hospital and they have no  Orders. Told pt him sorry I will send message to Dr. Jonni Sanger so she puts the orders in and someone will be in touch with you to schedule. Taylor Hines verbalized understanding.

## 2019-04-02 NOTE — Telephone Encounter (Signed)
Dr. Jonni Sanger, please place orders for MRI for pt. Father called about it and no orders are in. Thanks

## 2019-04-05 NOTE — Addendum Note (Signed)
Addended by: Billey Chang on: 04/05/2019 09:05 AM   Modules accepted: Orders

## 2019-04-06 ENCOUNTER — Telehealth: Payer: Self-pay | Admitting: Family Medicine

## 2019-04-06 ENCOUNTER — Other Ambulatory Visit: Payer: Self-pay

## 2019-04-06 DIAGNOSIS — R1900 Intra-abdominal and pelvic swelling, mass and lump, unspecified site: Secondary | ICD-10-CM

## 2019-04-06 NOTE — Telephone Encounter (Signed)
See note  Copied from [place] [id number]. Topic: General - Other >> Apr 06, 2019  9:38 AM Yvette Rack wrote: Reason for CRM: Manus Gunning with [facility] stated they need the order changed to MRI abdomen with and without contrast. Cb# [phone]

## 2019-04-06 NOTE — Telephone Encounter (Signed)
Order has been placed.

## 2019-04-29 ENCOUNTER — Other Ambulatory Visit: Payer: Self-pay

## 2019-04-29 ENCOUNTER — Ambulatory Visit
Admission: RE | Admit: 2019-04-29 | Discharge: 2019-04-29 | Disposition: A | Discharge status: Home / Self Care | Payer: PPO | Source: Ambulatory Visit | Attending: Family Medicine | Admitting: Family Medicine

## 2019-04-29 DIAGNOSIS — R1900 Intra-abdominal and pelvic swelling, mass and lump, unspecified site: Secondary | ICD-10-CM

## 2019-04-29 DIAGNOSIS — K5730 Diverticulosis of large intestine without perforation or abscess without bleeding: Secondary | ICD-10-CM | POA: Diagnosis not present

## 2019-04-29 DIAGNOSIS — K429 Umbilical hernia without obstruction or gangrene: Secondary | ICD-10-CM | POA: Diagnosis not present

## 2019-04-29 MED ORDER — GADOBENATE DIMEGLUMINE 529 MG/ML IV SOLN
19.0000 mL | Freq: Once | INTRAVENOUS | Status: AC | PRN
  Administered 2019-04-29: 19 mL via INTRAVENOUS

## 2019-04-29 NOTE — Progress Notes (Signed)
I was called to see the patient in MRI at [place] for a suspected contrast reaction. Following IV administration of MultiHance, the patient experienced sneezing, a feeling of throat tightness, lip swelling, and nasal congestion. The patient was alert and oriented and able to speak in complete sentences. Lung auscultation was normal without wheezing or stridor. O2 sat of 100% on RA. BP 110s-120s/80s. The patient was given 50 mg of Benadryl po and monitored for >30 minutes during which time his symptoms improved. He was released into the care of his father with instructions given to seek immediate medical attention for any new or worsening symptoms.

## 2019-04-30 ENCOUNTER — Telehealth: Payer: Self-pay | Admitting: Family Medicine

## 2019-04-30 NOTE — Telephone Encounter (Signed)
Please advise 

## 2019-04-30 NOTE — Telephone Encounter (Signed)
See note

## 2019-04-30 NOTE — Telephone Encounter (Signed)
Patient's father calling to check status of MRI results. Please advise.

## 2019-05-03 NOTE — Telephone Encounter (Signed)
Patient's father would like to know in the event these are lipomas:  #1 could they ever become cancerous, and #2 should they be removed?

## 2019-05-03 NOTE — Telephone Encounter (Signed)
Please let patient's father know that the MRI did not show any masses. There were NO worrisome findings.  I am trying to reach out to radiology to help explain what the ultrasound masses show and if they could be benign lipomas.   At this time, nothing further is needed. It seems that the lumps are benign.

## 2019-05-03 NOTE — Telephone Encounter (Signed)
Patient's father returning call for results.

## 2019-05-05 ENCOUNTER — Other Ambulatory Visit: Payer: Self-pay

## 2019-05-05 ENCOUNTER — Encounter: Payer: Self-pay | Admitting: Emergency Medicine

## 2019-05-05 ENCOUNTER — Emergency Department
Admission: EM | Admit: 2019-05-05 | Discharge: 2019-05-05 | Disposition: A | Discharge status: Home / Self Care | Payer: PPO | Source: Home / Self Care | Attending: Internal Medicine | Admitting: Internal Medicine

## 2019-05-05 ENCOUNTER — Ambulatory Visit: Payer: Self-pay

## 2019-05-05 DIAGNOSIS — I808 Phlebitis and thrombophlebitis of other sites: Secondary | ICD-10-CM

## 2019-05-05 NOTE — Telephone Encounter (Signed)
Incoming call from the parent of Patient who is autistic.  Patient recently had a MRI . Had a anaphylactic reaction.    Now there is a possibility that  That he may have a blood clot  above the wrist. Patient states its painful. Recommended Patient go to Urgent Care for further assessment.     Mother of Patient places a question to Dr. Jonni Sanger Mother want to know the components of the contrast for the MRI  Would like a return call.

## 2019-05-05 NOTE — ED Provider Notes (Signed)
[facility]    CSN: [id number] Arrival date & time: 05/05/19  1156      History   Chief Complaint Chief Complaint  Patient presents with  . Arm Pain    HPI Taylor Hines is a 33 y.o. male with history of autism-controlled, asthma-controlled comes to urgent care with right forearm pain of 2 days duration.  Patient had imaging studies with contrast a few days ago.  Patient suffered an allergic reaction to the contrast dye.  Following that day he started experiencing pain in the forearm.  Pain is throbbing and intermittent.  Currently is of moderate severity.  No known relieving factors..  No aggravating factors.  No erythema, fever or chills.  No radiation of pain.Marland Kitchen   HPI  Past Medical History:  Diagnosis Date  . Allergy   . Asthma   . Autism   . Depression   . Essential hypertension 02/26/2019  . Family history of malignant melanoma - father 02/26/2019  . History of chickenpox   . Hypertension   . Tachycardia     Patient Active Problem List   Diagnosis Date Noted  . High-functioning autism spectrum disorder 02/26/2019  . Essential hypertension 02/26/2019  . Mild persistent asthma 02/26/2019  . Chronic seasonal allergic rhinitis 02/26/2019  . Major depression, recurrent, chronic ([place]) 02/26/2019  . Family history of malignant melanoma - father 02/26/2019    Past Surgical History:  Procedure Laterality Date  . EXTERNAL EAR SURGERY    . TONSILLECTOMY AND ADENOIDECTOMY    . TYMPANOSTOMY TUBE PLACEMENT         Home Medications    Prior to Admission medications   Medication Sig Start Date End Date Taking? Authorizing Provider  acetaminophen (TYLENOL) 500 MG tablet Take 500 mg by mouth 2 (two) times daily.    [provider]  albuterol (PROVENTIL HFA;VENTOLIN HFA) 108 (90 Base) MCG/ACT inhaler Inhale 2 puffs into the lungs every 6 (six) hours as needed for wheezing or shortness of breath.    [provider]  beclomethasone (QVAR)  40 MCG/ACT inhaler Inhale 1 puff into the lungs 2 (two) times daily.    [provider]  cephALEXin (KEFLEX) 500 MG capsule Take 1 capsule (500 mg total) by mouth 2 (two) times daily. Patient not taking: Reported on 02/26/2019 01/26/19   Brunetta Jeans, PA-C  citalopram (CELEXA) 10 MG tablet Take 10 mg by mouth daily.    [provider]  fluticasone (FLONASE) 50 MCG/ACT nasal spray Place 2 sprays into both nostrils daily.    [provider]  lisinopril (PRINIVIL,ZESTRIL) 20 MG tablet Take 20 mg by mouth daily.    [provider]  methylphenidate (RITALIN LA) 20 MG 24 hr capsule Take 60 mg by mouth every morning.    [provider]  Multiple Vitamin (MULTIVITAMIN) tablet Take 1 tablet by mouth daily.    [provider]    Family History Family History  Problem Relation Age of Onset  . Asthma Mother   . Diabetes Mother        Prediabetes  . Hyperlipidemia Mother   . Cancer Father        Skin  . Depression Father   . Hearing loss Father   . Hypertension Father   . Asthma Brother   . Depression Brother   . Arthritis Maternal Grandmother   . Diabetes Maternal Grandmother   . Hearing loss Maternal Grandmother   . Hyperlipidemia Maternal Grandmother   . Hypertension  Maternal Grandmother   . Miscarriages / Stillbirths Maternal Grandmother   . Alcohol abuse Maternal Grandfather   . Cancer Maternal Grandfather        Prostate  . Cancer Paternal Grandmother        Breast  . Hearing loss Paternal Grandmother   . Miscarriages / Stillbirths Paternal Grandmother   . Cancer Paternal Grandfather        Skin  . Bipolar disorder Paternal Grandfather   . Asthma Brother   . Depression Brother     Social History Social History   Tobacco Use  . Smoking status: Never Smoker  . Smokeless tobacco: Never Used  Substance Use Topics  . Alcohol use: Yes  . Drug use: Never     Allergies   Bactrim [sulfamethoxazole-trimethoprim],  Multihance [gadobenate], Nsaids, and Contrast media [iodinated diagnostic agents]   Review of Systems Review of Systems  Constitutional: Negative for activity change, fatigue and fever.  HENT: Negative.   Respiratory: Negative.   Cardiovascular: Negative.  Negative for chest pain and palpitations.  Gastrointestinal: Negative.   Genitourinary: Negative.   Skin: Negative.  Negative for rash and wound.  Neurological: Negative.      Physical Exam Triage Vital Signs ED Triage Vitals  Enc Vitals Group     BP 05/05/19 1229 123/81     Pulse Rate 05/05/19 1229 100     Resp --      Temp 05/05/19 1229 97.9 F (36.6 C)     Temp Source 05/05/19 1229 Oral     SpO2 05/05/19 1229 100 %     Weight --      Height --      Head Circumference --      Peak Flow --      Pain Score 05/05/19 1226 6     Pain Loc --      Pain Edu? --      Excl. in [place]? --    No data found.  Updated Vital Signs BP 123/81 (BP Location: Right Arm)   Pulse 100   Temp 97.9 F (36.6 C) (Oral)   SpO2 100%   Visual Acuity Right Eye Distance:   Left Eye Distance:   Bilateral Distance:    Right Eye Near:   Left Eye Near:    Bilateral Near:     Physical Exam Vitals and nursing note reviewed.  Constitutional:      General: He is not in acute distress.    Appearance: Normal appearance. He is not ill-appearing.  Cardiovascular:     Rate and Rhythm: Normal rate and regular rhythm.     Pulses: Normal pulses.     Heart sounds: Normal heart sounds.  Pulmonary:     Effort: Pulmonary effort is normal.     Breath sounds: Normal breath sounds.  Abdominal:     General: Bowel sounds are normal.     Palpations: Abdomen is soft.  Musculoskeletal:     Comments: Tender cordlike swelling in the right forearm consistent with superficial thrombophlebitis  Skin:    General: Skin is warm.     Capillary Refill: Capillary refill takes less than 2 seconds.  Neurological:     Mental Status: He is alert.      UC  Treatments / Results  Labs (all labs ordered are listed, but only abnormal results are displayed) Labs Reviewed - No data to display  EKG   Radiology No results found.  Procedures Procedures (including critical care time)  Medications Ordered  in UC Medications - No data to display  Initial Impression / Assessment and Plan / UC Course  I have reviewed the triage vital signs and the nursing notes.  Pertinent labs & imaging results that were available during my care of the patient were reviewed by me and considered in my medical decision making (see chart for details).     1.  Superficial thrombophlebitis over the right forearm: Warm compress Tylenol as needed for pain Monitor for redness, worsening swelling and pain.  If symptoms persist patient is advised to return to urgent care to be reevaluated. Final Clinical Impressions(s) / UC Diagnoses   Final diagnoses:  Superficial thrombophlebitis of right upper extremity     Discharge Instructions     Please  apply heating pad to the area involved for 5 minutes at a time with the rest for 15 minutes over 1 hour.   ED Prescriptions    None     PDMP not reviewed this encounter.   Chase Picket, MD 05/05/19 1346

## 2019-05-05 NOTE — ED Triage Notes (Signed)
Pt c/o right arm pain since Thursday. He had an MRI done and had anaphylaxis to contrast. Right  arm had the IV.

## 2019-05-05 NOTE — Telephone Encounter (Signed)
Reviewed notes; superficial thrombophlebitis.   Spoke with father. See note.   Allergic reaction to multihance dye; added to allergy list.  Not iodine based. All questions answered

## 2019-05-05 NOTE — Telephone Encounter (Signed)
Spoke to Dr. Altamese Cabal of [facility] who looked at both studies again. There is NO definitive mass on the MRI which is very reassuring. It could be a lipoma. Recommend waitful watching.   Spoke to father: explained these findings. Will wait and monitor. No need for further investigation at this time.   Discussed allergic reaction to Multihance MRI dye. Answered questions. I do not believe this is iodine based. It has benn added it to his allergy list.

## 2019-05-05 NOTE — Discharge Instructions (Signed)
Please  apply heating pad to the area involved for 5 minutes at a time with the rest for 15 minutes over 1 hour.

## 2019-05-05 NOTE — Telephone Encounter (Signed)
Per imaging report:  Ultrasound 03-29-2019 Patient is autistic. Best images obtainable. Difficulty holding his breath. Patent had a reaction also post contrast. Difficulty breathing hard to swallow. Sneezing and coughing.

## 2019-05-25 DIAGNOSIS — F411 Generalized anxiety disorder: Secondary | ICD-10-CM | POA: Diagnosis not present

## 2019-05-25 DIAGNOSIS — F900 Attention-deficit hyperactivity disorder, predominantly inattentive type: Secondary | ICD-10-CM | POA: Diagnosis not present

## 2019-07-05 DIAGNOSIS — M9901 Segmental and somatic dysfunction of cervical region: Secondary | ICD-10-CM | POA: Diagnosis not present

## 2019-07-05 DIAGNOSIS — M5416 Radiculopathy, lumbar region: Secondary | ICD-10-CM | POA: Diagnosis not present

## 2019-07-05 DIAGNOSIS — M531 Cervicobrachial syndrome: Secondary | ICD-10-CM | POA: Diagnosis not present

## 2019-07-05 DIAGNOSIS — M5414 Radiculopathy, thoracic region: Secondary | ICD-10-CM | POA: Diagnosis not present

## 2019-07-05 DIAGNOSIS — M9902 Segmental and somatic dysfunction of thoracic region: Secondary | ICD-10-CM | POA: Diagnosis not present

## 2019-07-05 DIAGNOSIS — M9903 Segmental and somatic dysfunction of lumbar region: Secondary | ICD-10-CM | POA: Diagnosis not present

## 2019-07-28 ENCOUNTER — Telehealth: Payer: Self-pay

## 2019-07-28 NOTE — Telephone Encounter (Signed)
Patient mother [name] ) drop of some paper work for Coverage for Incapacitated Child. This form will in be in Dr. Jonni Sanger folder. Please contact [name] when this is complete. Best contact number is [phone]

## 2019-07-29 NOTE — Telephone Encounter (Signed)
Noted  

## 2019-08-05 NOTE — Telephone Encounter (Signed)
Patient's mother is calling back and asking if [place] called.

## 2019-08-05 NOTE — Telephone Encounter (Signed)
Returned mother's [name]) call. Notified that paperwork is completed and ready to be picked up at the front desk.

## 2019-08-25 DIAGNOSIS — F411 Generalized anxiety disorder: Secondary | ICD-10-CM | POA: Diagnosis not present

## 2019-08-25 DIAGNOSIS — F900 Attention-deficit hyperactivity disorder, predominantly inattentive type: Secondary | ICD-10-CM | POA: Diagnosis not present

## 2019-11-25 DIAGNOSIS — F840 Autistic disorder: Secondary | ICD-10-CM | POA: Diagnosis not present

## 2019-11-25 DIAGNOSIS — F900 Attention-deficit hyperactivity disorder, predominantly inattentive type: Secondary | ICD-10-CM | POA: Diagnosis not present

## 2019-11-25 DIAGNOSIS — F411 Generalized anxiety disorder: Secondary | ICD-10-CM | POA: Diagnosis not present

## 2019-12-31 DIAGNOSIS — M5414 Radiculopathy, thoracic region: Secondary | ICD-10-CM | POA: Diagnosis not present

## 2019-12-31 DIAGNOSIS — M5416 Radiculopathy, lumbar region: Secondary | ICD-10-CM | POA: Diagnosis not present

## 2019-12-31 DIAGNOSIS — M531 Cervicobrachial syndrome: Secondary | ICD-10-CM | POA: Diagnosis not present

## 2019-12-31 DIAGNOSIS — M9901 Segmental and somatic dysfunction of cervical region: Secondary | ICD-10-CM | POA: Diagnosis not present

## 2019-12-31 DIAGNOSIS — M9902 Segmental and somatic dysfunction of thoracic region: Secondary | ICD-10-CM | POA: Diagnosis not present

## 2019-12-31 DIAGNOSIS — M9903 Segmental and somatic dysfunction of lumbar region: Secondary | ICD-10-CM | POA: Diagnosis not present

## 2020-02-04 DIAGNOSIS — M9903 Segmental and somatic dysfunction of lumbar region: Secondary | ICD-10-CM | POA: Diagnosis not present

## 2020-02-04 DIAGNOSIS — M531 Cervicobrachial syndrome: Secondary | ICD-10-CM | POA: Diagnosis not present

## 2020-02-04 DIAGNOSIS — M9901 Segmental and somatic dysfunction of cervical region: Secondary | ICD-10-CM | POA: Diagnosis not present

## 2020-02-04 DIAGNOSIS — M9902 Segmental and somatic dysfunction of thoracic region: Secondary | ICD-10-CM | POA: Diagnosis not present

## 2020-02-04 DIAGNOSIS — M5414 Radiculopathy, thoracic region: Secondary | ICD-10-CM | POA: Diagnosis not present

## 2020-02-04 DIAGNOSIS — M5416 Radiculopathy, lumbar region: Secondary | ICD-10-CM | POA: Diagnosis not present

## 2020-02-18 DIAGNOSIS — F411 Generalized anxiety disorder: Secondary | ICD-10-CM | POA: Diagnosis not present

## 2020-02-18 DIAGNOSIS — F900 Attention-deficit hyperactivity disorder, predominantly inattentive type: Secondary | ICD-10-CM | POA: Diagnosis not present

## 2020-02-18 DIAGNOSIS — F840 Autistic disorder: Secondary | ICD-10-CM | POA: Diagnosis not present

## 2020-02-24 DIAGNOSIS — M5414 Radiculopathy, thoracic region: Secondary | ICD-10-CM | POA: Diagnosis not present

## 2020-02-24 DIAGNOSIS — M9902 Segmental and somatic dysfunction of thoracic region: Secondary | ICD-10-CM | POA: Diagnosis not present

## 2020-02-24 DIAGNOSIS — M5416 Radiculopathy, lumbar region: Secondary | ICD-10-CM | POA: Diagnosis not present

## 2020-02-24 DIAGNOSIS — M531 Cervicobrachial syndrome: Secondary | ICD-10-CM | POA: Diagnosis not present

## 2020-02-24 DIAGNOSIS — M9901 Segmental and somatic dysfunction of cervical region: Secondary | ICD-10-CM | POA: Diagnosis not present

## 2020-02-24 DIAGNOSIS — M9903 Segmental and somatic dysfunction of lumbar region: Secondary | ICD-10-CM | POA: Diagnosis not present

## 2020-03-13 IMAGING — MR MR ABDOMEN WO/W CM
11 of 16 series · 26 of 48 positions shown · IV contrast (19ml Multihance)
Comparison: None.

CLINICAL DATA: Evaluate mass.

EXAM:
MRI ABDOMEN WITHOUT AND WITH CONTRAST
TECHNIQUE: Multiplanar multisequence MR imaging of the abdomen was performed
both before and after the administration of intravenous contrast.
CONTRAST:  19mL MULTIHANCE GADOBENATE DIMEGLUMINE 529 MG/ML IV SOLN

[Series 3: cor haste · coronal · 5.0mm · 0.74mm/px · 1 of 32 slices shown]
[im 1/32]
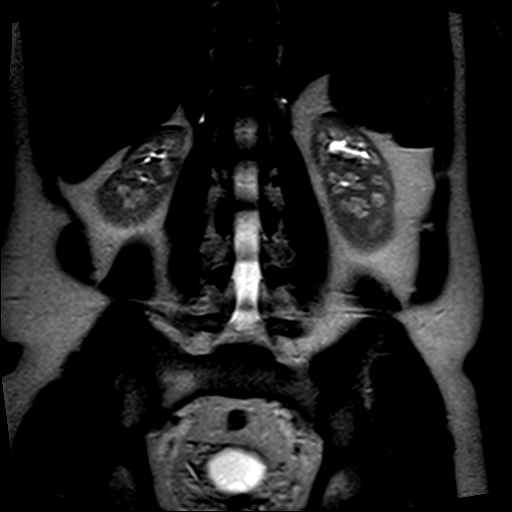

[Series 4: T1 · axial · 6.0mm · 0.78mm/px · z∈[-144,+48]mm · 2 of 60 slices shown]
[im 1/60]
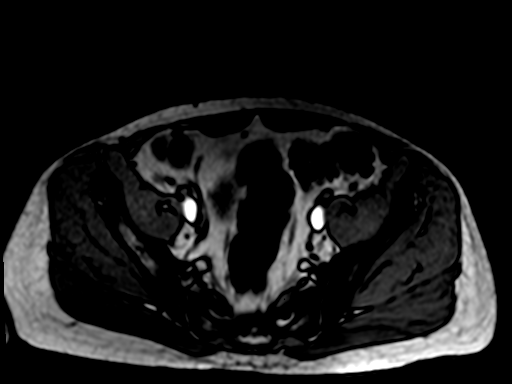
[im 60/60]
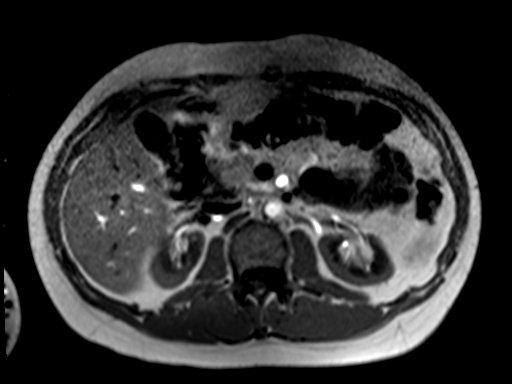

[Series 5: axial haste · axial · 6.0mm · 0.78mm/px · 1 of 30 slices shown]
[im 1/30]
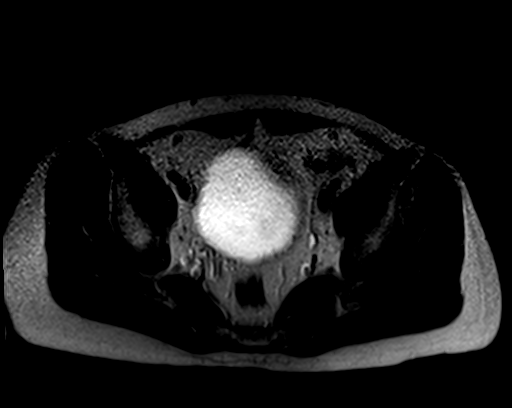

[Series 8: T2 · axial · 6.0mm · 1.28mm/px · 1 of 30 slices shown]
[im 1/30]
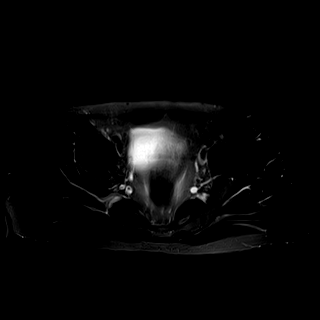

[Series 9: ep2d_diff_b50_500_800_p2_trig · axial · 6.0mm · 2.19mm/px · z∈[-141,+68]mm · 3 of 90 slices shown]
[im 1/90]
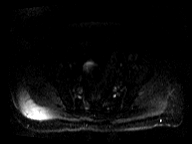
[im 45/90]
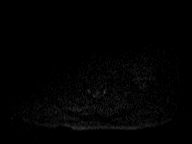
[im 90/90]
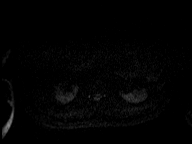

[Series 10: ep2d_[id number]_500_800_p2_trig_adc · axial · 6.0mm · 2.19mm/px · 1 of 30 slices shown]
[im 1/30]
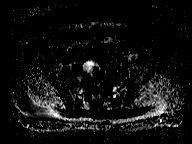

[Series 11: T1 dynamic · axial · non-contrast · 2.5mm · 0.82mm/px · z∈[-155,+42]mm · 3 of 80 slices shown]
[im 1/80]
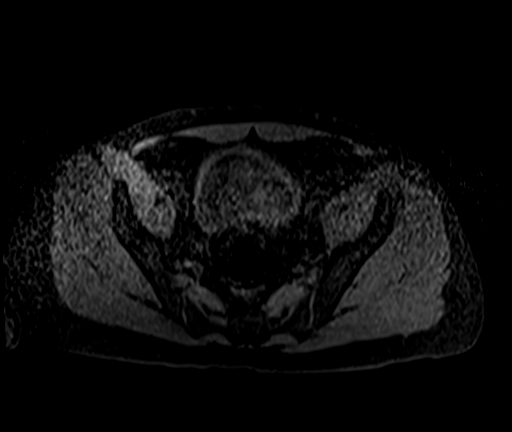
[im 40/80]
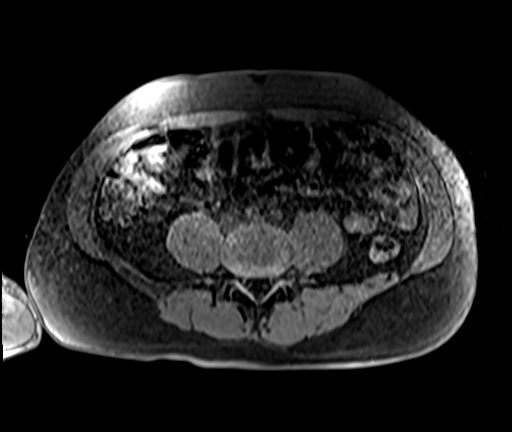
[im 80/80]
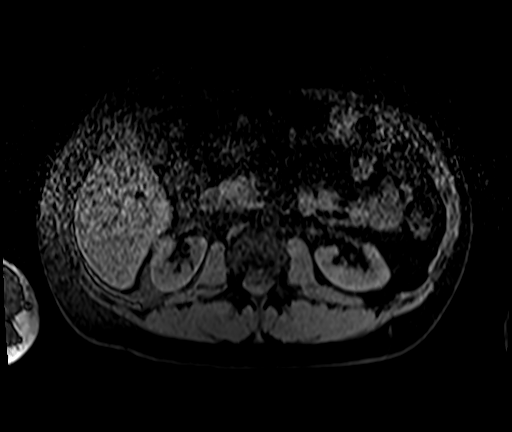

[Series 12: T1 dynamic post-contrast · axial · 2.5mm · 0.82mm/px · z∈[-155,+42]mm · 4 of 80 slices shown (1 of 4)]
[im 1/80]
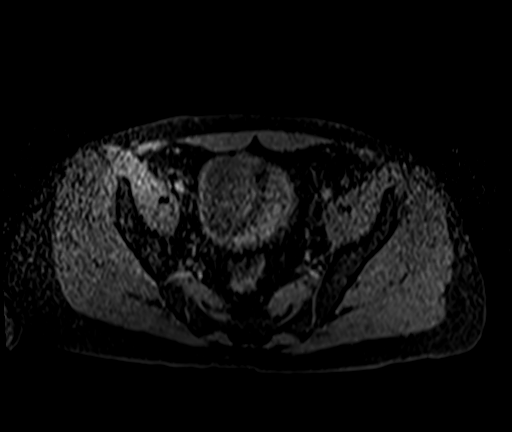
[im 27/80]
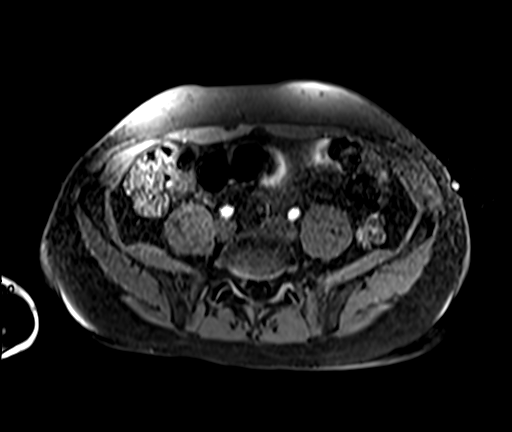
[im 53/80]
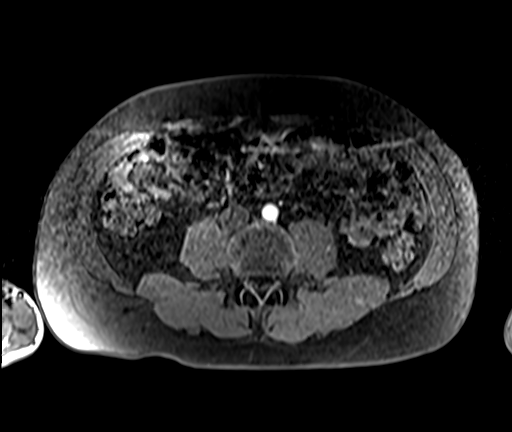
[im 80/80]
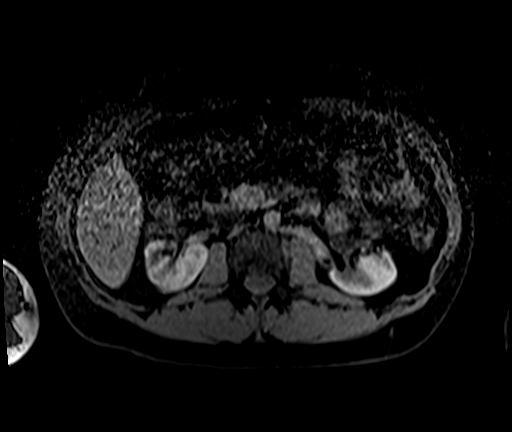

[Series 13: T1 dynamic post-contrast · axial · 2.5mm · 0.82mm/px · z∈[-155,+42]mm · 4 of 80 slices shown (2 of 4)]
[im 1/80]
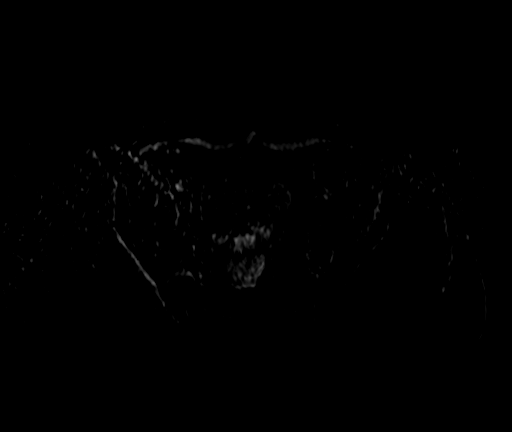
[im 27/80]
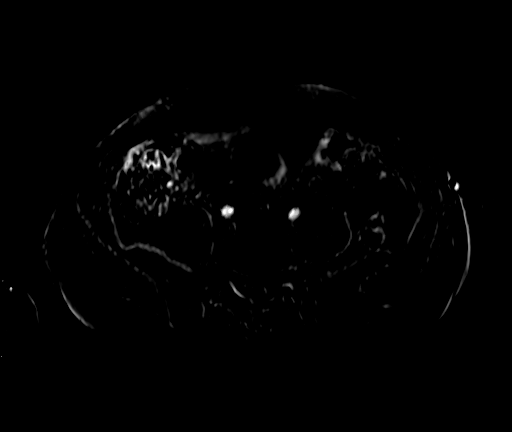
[im 53/80]
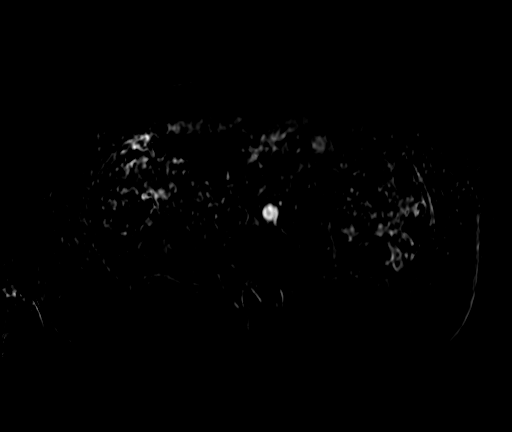
[im 80/80]
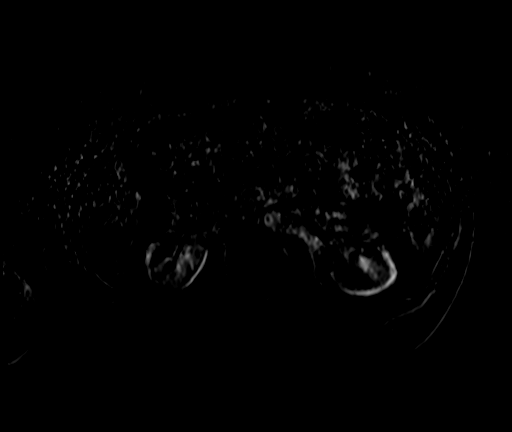

[Series 14: T1 dynamic post-contrast · axial · 2.5mm · 0.82mm/px · z∈[-155,+42]mm · 4 of 80 slices shown (3 of 4)]
[im 1/80]
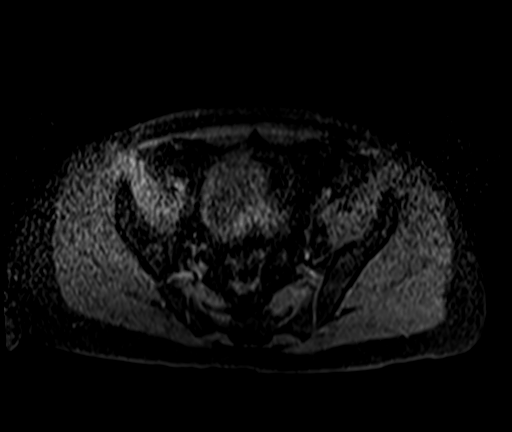
[im 27/80]
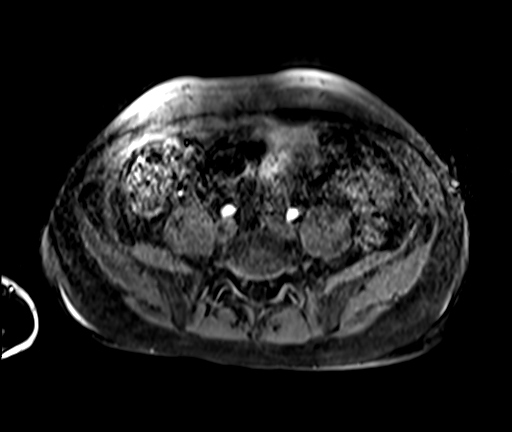
[im 53/80]
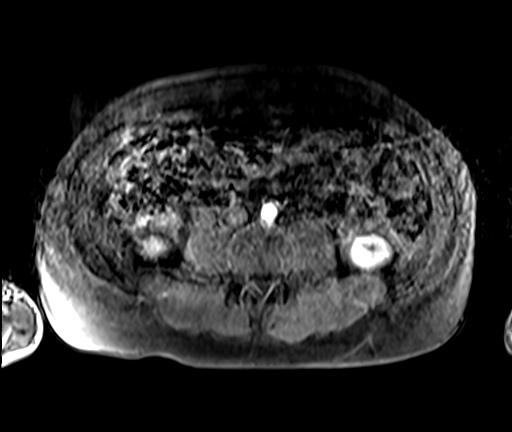
[im 80/80]
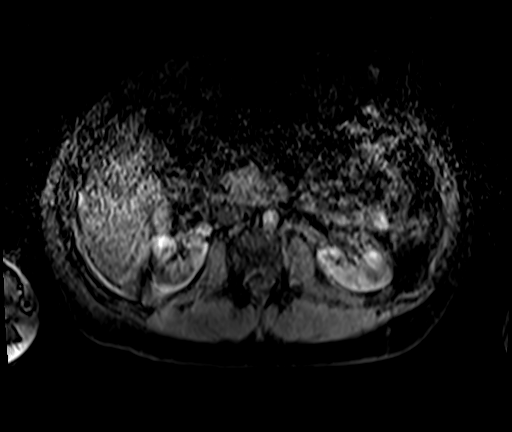

[Series 15: T1 dynamic post-contrast · axial · 2.5mm · 0.82mm/px · z∈[-155,-90]mm · 2 of 80 slices shown (4 of 4)]
[im 1/80]
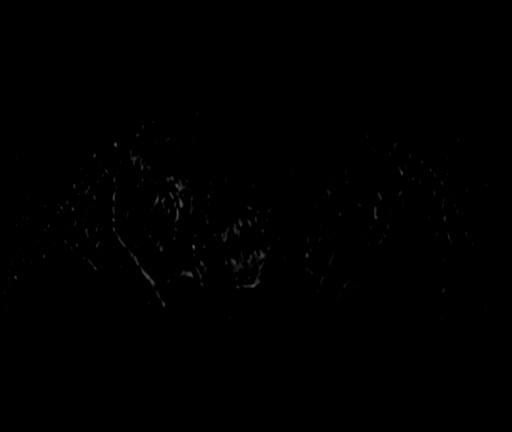
[im 27/80]
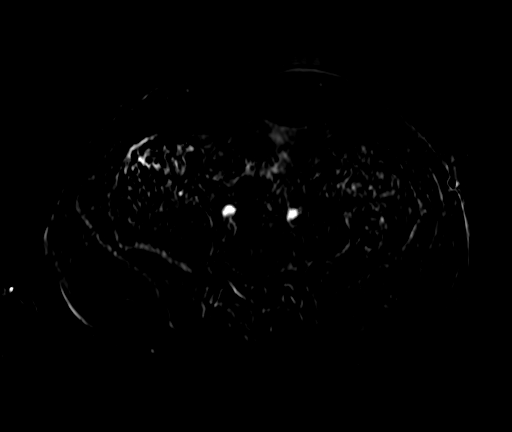

[26 of 48 positions shown; findings below may reference images not displayed]

FINDINGS: Lower chest: No acute findings.

Hepatobiliary: Imaged portions of the liver unremarkable.

Pancreas: No mass, inflammatory changes, or other parenchymal
abnormality identified.

Spleen:  Within normal limits in size and appearance.

Adrenals/Urinary Tract: Normal appearance of the adrenal glands. No
mass or hydronephrosis identified. Left-sided extrarenal pelvis
identified.

Stomach/Bowel: The stomach is unremarkable. No abnormal bowel wall
thickening, inflammation or distension noted. Extensive left-sided
colonic diverticulosis noted.

Vascular/Lymphatic: No pathologically enlarged lymph nodes
identified. No abdominal aortic aneurysm demonstrated.

Other: No free fluid or fluid collections. Small fat containing
umbilical hernia. Fiducial markers were placed along bilateral flank
over the area of concern. No underlying mass identified. No
correlate to the ultrasound findings.

Musculoskeletal: No suspicious bone lesions identified.
IMPRESSION: 1. No mass identified to correspond with the ultrasound findings.
2. Left-sided colonic diverticulosis.
3. Small fat containing umbilical hernia.

## 2020-04-22 DIAGNOSIS — M531 Cervicobrachial syndrome: Secondary | ICD-10-CM | POA: Diagnosis not present

## 2020-04-22 DIAGNOSIS — M9901 Segmental and somatic dysfunction of cervical region: Secondary | ICD-10-CM | POA: Diagnosis not present

## 2020-04-22 DIAGNOSIS — M5414 Radiculopathy, thoracic region: Secondary | ICD-10-CM | POA: Diagnosis not present

## 2020-04-22 DIAGNOSIS — M5416 Radiculopathy, lumbar region: Secondary | ICD-10-CM | POA: Diagnosis not present

## 2020-04-22 DIAGNOSIS — M9903 Segmental and somatic dysfunction of lumbar region: Secondary | ICD-10-CM | POA: Diagnosis not present

## 2020-04-22 DIAGNOSIS — M9902 Segmental and somatic dysfunction of thoracic region: Secondary | ICD-10-CM | POA: Diagnosis not present

## 2020-05-21 DIAGNOSIS — R059 Cough, unspecified: Secondary | ICD-10-CM | POA: Diagnosis not present

## 2020-05-21 DIAGNOSIS — U071 COVID-19: Secondary | ICD-10-CM | POA: Diagnosis not present

## 2020-06-02 DIAGNOSIS — M9901 Segmental and somatic dysfunction of cervical region: Secondary | ICD-10-CM | POA: Diagnosis not present

## 2020-06-02 DIAGNOSIS — M9903 Segmental and somatic dysfunction of lumbar region: Secondary | ICD-10-CM | POA: Diagnosis not present

## 2020-06-02 DIAGNOSIS — M5414 Radiculopathy, thoracic region: Secondary | ICD-10-CM | POA: Diagnosis not present

## 2020-06-02 DIAGNOSIS — M5416 Radiculopathy, lumbar region: Secondary | ICD-10-CM | POA: Diagnosis not present

## 2020-06-02 DIAGNOSIS — M9902 Segmental and somatic dysfunction of thoracic region: Secondary | ICD-10-CM | POA: Diagnosis not present

## 2020-06-02 DIAGNOSIS — M531 Cervicobrachial syndrome: Secondary | ICD-10-CM | POA: Diagnosis not present

## 2020-06-29 DIAGNOSIS — F900 Attention-deficit hyperactivity disorder, predominantly inattentive type: Secondary | ICD-10-CM | POA: Diagnosis not present

## 2020-06-29 DIAGNOSIS — F411 Generalized anxiety disorder: Secondary | ICD-10-CM | POA: Diagnosis not present

## 2020-06-29 DIAGNOSIS — F840 Autistic disorder: Secondary | ICD-10-CM | POA: Diagnosis not present

## 2020-07-01 DIAGNOSIS — M5414 Radiculopathy, thoracic region: Secondary | ICD-10-CM | POA: Diagnosis not present

## 2020-07-01 DIAGNOSIS — M9903 Segmental and somatic dysfunction of lumbar region: Secondary | ICD-10-CM | POA: Diagnosis not present

## 2020-07-01 DIAGNOSIS — M9901 Segmental and somatic dysfunction of cervical region: Secondary | ICD-10-CM | POA: Diagnosis not present

## 2020-07-01 DIAGNOSIS — M531 Cervicobrachial syndrome: Secondary | ICD-10-CM | POA: Diagnosis not present

## 2020-07-01 DIAGNOSIS — M5416 Radiculopathy, lumbar region: Secondary | ICD-10-CM | POA: Diagnosis not present

## 2020-07-01 DIAGNOSIS — M9902 Segmental and somatic dysfunction of thoracic region: Secondary | ICD-10-CM | POA: Diagnosis not present

## 2020-07-15 DIAGNOSIS — M5414 Radiculopathy, thoracic region: Secondary | ICD-10-CM | POA: Diagnosis not present

## 2020-07-15 DIAGNOSIS — M9903 Segmental and somatic dysfunction of lumbar region: Secondary | ICD-10-CM | POA: Diagnosis not present

## 2020-07-15 DIAGNOSIS — M9901 Segmental and somatic dysfunction of cervical region: Secondary | ICD-10-CM | POA: Diagnosis not present

## 2020-07-15 DIAGNOSIS — M531 Cervicobrachial syndrome: Secondary | ICD-10-CM | POA: Diagnosis not present

## 2020-07-15 DIAGNOSIS — M5416 Radiculopathy, lumbar region: Secondary | ICD-10-CM | POA: Diagnosis not present

## 2020-07-15 DIAGNOSIS — M9902 Segmental and somatic dysfunction of thoracic region: Secondary | ICD-10-CM | POA: Diagnosis not present

## 2020-08-26 DIAGNOSIS — M9902 Segmental and somatic dysfunction of thoracic region: Secondary | ICD-10-CM | POA: Diagnosis not present

## 2020-08-26 DIAGNOSIS — M9903 Segmental and somatic dysfunction of lumbar region: Secondary | ICD-10-CM | POA: Diagnosis not present

## 2020-08-26 DIAGNOSIS — M5416 Radiculopathy, lumbar region: Secondary | ICD-10-CM | POA: Diagnosis not present

## 2020-08-26 DIAGNOSIS — M5414 Radiculopathy, thoracic region: Secondary | ICD-10-CM | POA: Diagnosis not present

## 2020-08-26 DIAGNOSIS — M531 Cervicobrachial syndrome: Secondary | ICD-10-CM | POA: Diagnosis not present

## 2020-08-26 DIAGNOSIS — M9901 Segmental and somatic dysfunction of cervical region: Secondary | ICD-10-CM | POA: Diagnosis not present

## 2020-09-12 DIAGNOSIS — F902 Attention-deficit hyperactivity disorder, combined type: Secondary | ICD-10-CM | POA: Diagnosis not present

## 2020-09-12 DIAGNOSIS — F419 Anxiety disorder, unspecified: Secondary | ICD-10-CM | POA: Diagnosis not present

## 2020-09-16 DIAGNOSIS — M9902 Segmental and somatic dysfunction of thoracic region: Secondary | ICD-10-CM | POA: Diagnosis not present

## 2020-09-16 DIAGNOSIS — M5414 Radiculopathy, thoracic region: Secondary | ICD-10-CM | POA: Diagnosis not present

## 2020-09-16 DIAGNOSIS — M5416 Radiculopathy, lumbar region: Secondary | ICD-10-CM | POA: Diagnosis not present

## 2020-09-16 DIAGNOSIS — M9901 Segmental and somatic dysfunction of cervical region: Secondary | ICD-10-CM | POA: Diagnosis not present

## 2020-09-16 DIAGNOSIS — M531 Cervicobrachial syndrome: Secondary | ICD-10-CM | POA: Diagnosis not present

## 2020-09-16 DIAGNOSIS — M9903 Segmental and somatic dysfunction of lumbar region: Secondary | ICD-10-CM | POA: Diagnosis not present

## 2020-09-19 ENCOUNTER — Other Ambulatory Visit: Payer: Self-pay | Admitting: Family Medicine

## 2020-09-20 ENCOUNTER — Telehealth: Payer: Self-pay

## 2020-09-20 ENCOUNTER — Other Ambulatory Visit: Payer: Self-pay | Admitting: Family Medicine

## 2020-09-20 NOTE — Telephone Encounter (Signed)
Let patients dad know he needs an appointment to receive 90 days due to not being seen in 2 years. Gave patient 30 days to last until appointment.

## 2020-09-20 NOTE — Telephone Encounter (Signed)
.   LAST APPOINTMENT DATE: 09/20/2020   NEXT APPOINTMENT DATE:@Visit  date not found  MEDICATION:lisinopril (ZESTRIL) 20 MG tablet   PHARMACY:[id number] - [place], [place] - [address] [place] [address] [place] [address]

## 2020-10-07 DIAGNOSIS — M531 Cervicobrachial syndrome: Secondary | ICD-10-CM | POA: Diagnosis not present

## 2020-10-07 DIAGNOSIS — M9901 Segmental and somatic dysfunction of cervical region: Secondary | ICD-10-CM | POA: Diagnosis not present

## 2020-10-07 DIAGNOSIS — M9903 Segmental and somatic dysfunction of lumbar region: Secondary | ICD-10-CM | POA: Diagnosis not present

## 2020-10-07 DIAGNOSIS — M9902 Segmental and somatic dysfunction of thoracic region: Secondary | ICD-10-CM | POA: Diagnosis not present

## 2020-10-07 DIAGNOSIS — M5414 Radiculopathy, thoracic region: Secondary | ICD-10-CM | POA: Diagnosis not present

## 2020-10-07 DIAGNOSIS — M5416 Radiculopathy, lumbar region: Secondary | ICD-10-CM | POA: Diagnosis not present

## 2020-10-09 ENCOUNTER — Other Ambulatory Visit: Payer: Self-pay | Admitting: Family Medicine

## 2020-10-11 ENCOUNTER — Ambulatory Visit: Payer: PPO | Admitting: Family Medicine

## 2020-10-19 ENCOUNTER — Encounter: Payer: Self-pay | Admitting: Family Medicine

## 2020-10-19 ENCOUNTER — Other Ambulatory Visit: Payer: Self-pay

## 2020-10-19 ENCOUNTER — Ambulatory Visit (INDEPENDENT_AMBULATORY_CARE_PROVIDER_SITE_OTHER): Payer: PPO | Admitting: Family Medicine

## 2020-10-19 VITALS — BP 132/80 | HR 111 | Temp 98.4°F | Ht 72.0 in | Wt 228.5 lb

## 2020-10-19 DIAGNOSIS — F339 Major depressive disorder, recurrent, unspecified: Secondary | ICD-10-CM | POA: Diagnosis not present

## 2020-10-19 DIAGNOSIS — F840 Autistic disorder: Secondary | ICD-10-CM

## 2020-10-19 DIAGNOSIS — Z1159 Encounter for screening for other viral diseases: Secondary | ICD-10-CM

## 2020-10-19 DIAGNOSIS — I10 Essential (primary) hypertension: Secondary | ICD-10-CM

## 2020-10-19 DIAGNOSIS — Z808 Family history of malignant neoplasm of other organs or systems: Secondary | ICD-10-CM

## 2020-10-19 DIAGNOSIS — J4520 Mild intermittent asthma, uncomplicated: Secondary | ICD-10-CM | POA: Diagnosis not present

## 2020-10-19 DIAGNOSIS — L739 Follicular disorder, unspecified: Secondary | ICD-10-CM

## 2020-10-19 DIAGNOSIS — D229 Melanocytic nevi, unspecified: Secondary | ICD-10-CM | POA: Diagnosis not present

## 2020-10-19 LAB — CBC WITH DIFFERENTIAL/PLATELET
Basophils Absolute: 0 10*3/uL (ref 0.0–0.1)
Basophils Relative: 0.6 % (ref 0.0–3.0)
Eosinophils Absolute: 0.4 10*3/uL (ref 0.0–0.7)
Eosinophils Relative: 6.1 % — ABNORMAL HIGH (ref 0.0–5.0)
HCT: 49.3 % (ref 39.0–52.0)
Hemoglobin: 16.8 g/dL (ref 13.0–17.0)
Lymphocytes Relative: 22.1 % (ref 12.0–46.0)
Lymphs Abs: 1.6 10*3/uL (ref 0.7–4.0)
MCHC: 34.1 g/dL (ref 30.0–36.0)
MCV: 91.9 fl (ref 78.0–100.0)
Monocytes Absolute: 0.7 10*3/uL (ref 0.1–1.0)
Monocytes Relative: 9.7 % (ref 3.0–12.0)
Neutro Abs: 4.5 10*3/uL (ref 1.4–7.7)
Neutrophils Relative %: 61.5 % (ref 43.0–77.0)
Platelets: 238 10*3/uL (ref 150.0–400.0)
RBC: 5.37 Mil/uL (ref 4.22–5.81)
RDW: 13.4 % (ref 11.5–15.5)
WBC: 7.3 10*3/uL (ref 4.0–10.5)

## 2020-10-19 LAB — TSH: TSH: 2.86 u[IU]/mL (ref 0.35–4.50)

## 2020-10-19 MED ORDER — LISINOPRIL 20 MG PO TABS: 20.0000 mg | ORAL_TABLET | Freq: Every day | ORAL | 3 refills | Status: DC

## 2020-10-19 NOTE — Patient Instructions (Signed)
Please return in 6 months for blood pressure recheck and recheck moles  Please take a picture of the moles and monitor them for growth or changes.   We will call you with your lab results.  If you have any questions or concerns, please don't hesitate to send me a message via MyChart or call the office at [phone]. Thank you for visiting with [place] today! It's our pleasure caring for you.

## 2020-10-19 NOTE — Progress Notes (Signed)
Subjective  CC:  Chief Complaint  Patient presents with  . Medication Refill    HPI: Taylor Hines is a 35 y.o. male who presents to the office today to address the problems listed above in the chief complaint. Last seen 02/2019.  Hypertension f/u: Control is fair . Pt reports he is doing well. taking medications as instructed, no medication side effects noted, no TIAs, no chest pain on exertion, no dyspnea on exertion, no swelling of ankles. He is anxious at the moment because he is concerned about getting a shot.  Thus he has an elevated heart rate. He denies adverse effects from his BP medications. Compliance with medication is good.  He takes lisinopril 20 mg daily.  He denies palpitations.  Asthma: He has not used Qvar nor albuterol in the last year.  Denies chest tightness, wheezing, cough.  Family history of melanoma: Has not seen dermatology in over a year.  No new moles.  Red bumps on his legs, pimple-like.  Sometimes red and sore.  No systemic symptoms.  High functioning autism managed by psychiatry.  Stable on antidepressants.  Assessment  1. Essential hypertension   2. High-functioning autism spectrum disorder   3. Major depression, recurrent, chronic ([place])   4. Family history of malignant melanoma - father   37. Mild intermittent asthma without complication   6. Need for hepatitis C screening test   7. Multiple atypical skin moles   8. Folliculitis      Plan    Hypertension f/u: BP control is well controlled. Check renal function and electrolytes.  Refill lisinopril 20 mg daily  Autism and depression: Stable by report  Atypical moles f/u: We will monitor.  Recheck 6 months.  Recommend dermatology annual skin check.  Asthma, now mild intermittent.  Albuterol as needed.  Stop Uloric.  Folliculitis: Recommend antibacterial soap and antibiotic ointment as needed.  Reassured Education regarding management of these chronic disease states was given.  Management strategies discussed on successive visits include dietary and exercise recommendations, goals of achieving and maintaining IBW, and lifestyle modifications aiming for adequate sleep and minimizing stressors.   Follow up: 6 months hypertension recheck and will recheck  Orders Placed This Encounter  Procedures  . CBC with Differential/Platelet  . Comprehensive metabolic panel  . Lipid panel  . TSH  . Hepatitis C antibody   Meds ordered this encounter  Medications  . lisinopril (ZESTRIL) 20 MG tablet    Sig: Take 1 tablet (20 mg total) by mouth daily.    Dispense:  90 tablet    Refill:  3      BP Readings from Last 3 Encounters:  10/19/20 132/80  05/05/19 123/81  04/29/19 117/84   Wt Readings from Last 3 Encounters:  10/19/20 228 lb 8 oz (103.6 kg)  02/26/19 205 lb 3.2 oz (93.1 kg)  01/26/19 204 lb (92.5 kg)    Lab Results  Component Value Date   CHOL 191 02/26/2019   CHOL 201 (A) 01/29/2018   Lab Results  Component Value Date   HDL 46 02/26/2019   HDL 39 01/29/2018   Lab Results  Component Value Date   LDLCALC 117 (H) 02/26/2019   LDLCALC 148 01/29/2018   Lab Results  Component Value Date   TRIG 167 (H) 02/26/2019   TRIG 71 01/29/2018   Lab Results  Component Value Date   CHOLHDL 4.2 02/26/2019   No results found for: LDLDIRECT Lab Results  Component Value Date   CREATININE  1.31 02/26/2019   BUN 28 (H) 02/26/2019   NA 139 02/26/2019   K 4.5 02/26/2019   CL 104 02/26/2019   CO2 26 02/26/2019    The ASCVD Risk score Mikey Bussing DC Jr., et al., 2013) failed to calculate for the following reasons:   The 2013 ASCVD risk score is only valid for ages 42 to 55  I reviewed the patients updated PMH, FH, and SocHx.    Patient Active Problem List   Diagnosis Date Noted  . Mild intermittent asthma without complication 59/16/3846  . High-functioning autism spectrum disorder 02/26/2019  . Essential hypertension 02/26/2019  . Chronic seasonal allergic  rhinitis 02/26/2019  . Major depression, recurrent, chronic ([place]) 02/26/2019  . Family history of malignant melanoma - father 02/26/2019    Allergies: Bactrim [sulfamethoxazole-trimethoprim], Multihance [gadobenate], Nsaids, and Contrast media [iodinated diagnostic agents]  Social History: Patient  reports that he has never smoked. He has never used smokeless tobacco. He reports current alcohol use. He reports that he does not use drugs.  Current Meds  Medication Sig  . acetaminophen (TYLENOL) 500 MG tablet Take 500 mg by mouth 2 (two) times daily.  Marland Kitchen albuterol (PROVENTIL HFA;VENTOLIN HFA) 108 (90 Base) MCG/ACT inhaler Inhale 2 puffs into the lungs every 6 (six) hours as needed for wheezing or shortness of breath.  . citalopram (CELEXA) 10 MG tablet Take 10 mg by mouth daily.  . fluticasone (FLONASE) 50 MCG/ACT nasal spray Place 2 sprays into both nostrils daily.  . methylphenidate (RITALIN LA) 20 MG 24 hr capsule Take 60 mg by mouth every morning.  . Multiple Vitamin (MULTIVITAMIN) tablet Take 1 tablet by mouth daily.  . [DISCONTINUED] beclomethasone (QVAR) 40 MCG/ACT inhaler Inhale 1 puff into the lungs 2 (two) times daily.  . [DISCONTINUED] lisinopril (ZESTRIL) 20 MG tablet TAKE 1 TABLET BY MOUTH EVERY DAY    Review of Systems: Cardiovascular: negative for chest pain, palpitations, leg swelling, orthopnea Respiratory: negative for SOB, wheezing or persistent cough Gastrointestinal: negative for abdominal pain Genitourinary: negative for dysuria or gross hematuria  Objective  Vitals: BP 132/80   Pulse (!) 111   Temp 98.4 F (36.9 C)   Ht 6' (1.829 m)   Wt 228 lb 8 oz (103.6 kg)   SpO2 98%   BMI 30.99 kg/m  General: no acute distress  Psych:  Alert and oriented, normal mood and affect HEENT:  Normocephalic, atraumatic, supple neck  Cardiovascular:  RRR without murmur. no edema Respiratory:  Good breath sounds bilaterally, CTAB with normal respiratory  effort Gastrointestinal: soft, flat abdomen, normal active bowel sounds, no palpable masses, no hepatosplenomegaly, no appreciated hernias Skin:  Warm, no rashes, several mild small red papules on inner right upper leg.  Mild moles on abdomen with some color variation.  Normal borders.  See media for pictures. Neurologic:   Mental status is normal  Commons side effects, risks, benefits, and alternatives for medications and treatment plan prescribed today were discussed, and the patient expressed understanding of the given instructions. Patient is instructed to call or message via MyChart if he/she has any questions or concerns regarding our treatment plan. No barriers to understanding were identified. We discussed Red Flag symptoms and signs in detail. Patient expressed understanding regarding what to do in case of urgent or emergency type symptoms.   Medication list was reconciled, printed and provided to the patient in AVS. Patient instructions and summary information was reviewed with the patient as documented in the AVS. This note was prepared with  assistance of Systems analyst. Occasional wrong-word or sound-a-like substitutions may have occurred due to the inherent limitations of voice recognition software  This visit occurred during the SARS-CoV-2 public health emergency.  Safety protocols were in place, including screening questions prior to the visit, additional usage of staff PPE, and extensive cleaning of exam room while observing appropriate contact time as indicated for disinfecting solutions.

## 2020-10-20 LAB — LIPID PANEL
Cholesterol: 197 mg/dL (ref 0–200)
HDL: 44.4 mg/dL (ref >39.00)
LDL Cholesterol: 117 mg/dL — ABNORMAL HIGH (ref 0–99)
NonHDL: 153.04
Total CHOL/HDL Ratio: 4
Triglycerides: 182 mg/dL — ABNORMAL HIGH (ref 0.0–149.0)
VLDL: 36.4 mg/dL (ref 0.0–40.0)

## 2020-10-20 LAB — COMPREHENSIVE METABOLIC PANEL
ALT: 18 U/L (ref 0–53)
AST: 18 U/L (ref 0–37)
Albumin: 4.5 g/dL (ref 3.5–5.2)
Alkaline Phosphatase: 69 U/L (ref 39–117)
BUN: 26 mg/dL — ABNORMAL HIGH (ref 6–23)
CO2: 26 mEq/L (ref 19–32)
Calcium: 9.8 mg/dL (ref 8.4–10.5)
Chloride: 101 mEq/L (ref 96–112)
Creatinine, Ser: 1.39 mg/dL (ref 0.40–1.50)
GFR: 66.18 mL/min (ref >60.00)
Glucose, Bld: 86 mg/dL (ref 70–99)
Potassium: 4.6 mEq/L (ref 3.5–5.1)
Sodium: 140 mEq/L (ref 135–145)
Total Bilirubin: 0.5 mg/dL (ref 0.2–1.2)
Total Protein: 7 g/dL (ref 6.0–8.3)

## 2020-10-20 LAB — HEPATITIS C ANTIBODY
Hepatitis C Ab: NONREACTIVE
SIGNAL TO CUT-OFF: 0.02 (ref <1.00)

## 2020-10-24 ENCOUNTER — Encounter: Payer: Self-pay | Admitting: Family Medicine

## 2020-10-24 NOTE — Progress Notes (Signed)
Lab results mailed to patient in letter. Normal results. No action / follow up needed on these results.

## 2020-11-10 DIAGNOSIS — M531 Cervicobrachial syndrome: Secondary | ICD-10-CM | POA: Diagnosis not present

## 2020-11-10 DIAGNOSIS — M9902 Segmental and somatic dysfunction of thoracic region: Secondary | ICD-10-CM | POA: Diagnosis not present

## 2020-11-10 DIAGNOSIS — M9901 Segmental and somatic dysfunction of cervical region: Secondary | ICD-10-CM | POA: Diagnosis not present

## 2020-11-10 DIAGNOSIS — M9903 Segmental and somatic dysfunction of lumbar region: Secondary | ICD-10-CM | POA: Diagnosis not present

## 2020-11-10 DIAGNOSIS — M5416 Radiculopathy, lumbar region: Secondary | ICD-10-CM | POA: Diagnosis not present

## 2020-11-10 DIAGNOSIS — M5414 Radiculopathy, thoracic region: Secondary | ICD-10-CM | POA: Diagnosis not present

## 2020-11-14 DIAGNOSIS — F419 Anxiety disorder, unspecified: Secondary | ICD-10-CM | POA: Diagnosis not present

## 2020-11-14 DIAGNOSIS — F902 Attention-deficit hyperactivity disorder, combined type: Secondary | ICD-10-CM | POA: Diagnosis not present

## 2020-11-16 ENCOUNTER — Telehealth: Payer: Self-pay

## 2020-11-16 NOTE — Telephone Encounter (Signed)
Patient mother called in and the patient was picked for jury duty and needs a note to exempt the patient stating has Autism.

## 2020-11-16 NOTE — Telephone Encounter (Signed)
OK to write note

## 2020-11-21 ENCOUNTER — Telehealth: Payer: Self-pay

## 2020-11-21 NOTE — Telephone Encounter (Signed)
Patient's mother is aware that letter is ready for pick up at front desk

## 2020-11-21 NOTE — Telephone Encounter (Signed)
Patient's father is aware that I need original letter from the county that has case # on it to write letter. States he will either upload via mychart or drop by the office.

## 2020-11-21 NOTE — Telephone Encounter (Signed)
Pt's mom called in stating that Dr Jonni Sanger needed the [place] number: [phone]. Please call Mom at [phone] if there are any questions

## 2020-12-02 DIAGNOSIS — M9902 Segmental and somatic dysfunction of thoracic region: Secondary | ICD-10-CM | POA: Diagnosis not present

## 2020-12-02 DIAGNOSIS — M9901 Segmental and somatic dysfunction of cervical region: Secondary | ICD-10-CM | POA: Diagnosis not present

## 2020-12-02 DIAGNOSIS — M5414 Radiculopathy, thoracic region: Secondary | ICD-10-CM | POA: Diagnosis not present

## 2020-12-02 DIAGNOSIS — M531 Cervicobrachial syndrome: Secondary | ICD-10-CM | POA: Diagnosis not present

## 2020-12-02 DIAGNOSIS — M5416 Radiculopathy, lumbar region: Secondary | ICD-10-CM | POA: Diagnosis not present

## 2020-12-02 DIAGNOSIS — M9903 Segmental and somatic dysfunction of lumbar region: Secondary | ICD-10-CM | POA: Diagnosis not present

## 2020-12-05 ENCOUNTER — Other Ambulatory Visit: Payer: Self-pay

## 2020-12-05 ENCOUNTER — Encounter: Payer: Self-pay | Admitting: Physician Assistant

## 2020-12-05 ENCOUNTER — Ambulatory Visit: Payer: PPO | Admitting: Physician Assistant

## 2020-12-05 ENCOUNTER — Encounter (INDEPENDENT_AMBULATORY_CARE_PROVIDER_SITE_OTHER): Payer: Self-pay

## 2020-12-05 DIAGNOSIS — D224 Melanocytic nevi of scalp and neck: Secondary | ICD-10-CM | POA: Diagnosis not present

## 2020-12-05 DIAGNOSIS — Z1283 Encounter for screening for malignant neoplasm of skin: Secondary | ICD-10-CM | POA: Diagnosis not present

## 2020-12-05 DIAGNOSIS — Z808 Family history of malignant neoplasm of other organs or systems: Secondary | ICD-10-CM

## 2020-12-05 DIAGNOSIS — D225 Melanocytic nevi of trunk: Secondary | ICD-10-CM | POA: Diagnosis not present

## 2020-12-05 DIAGNOSIS — D171 Benign lipomatous neoplasm of skin and subcutaneous tissue of trunk: Secondary | ICD-10-CM

## 2020-12-05 DIAGNOSIS — L814 Other melanin hyperpigmentation: Secondary | ICD-10-CM

## 2020-12-05 NOTE — Progress Notes (Signed)
   Follow-Up Visit   Subjective  Taylor Hines is a 35 y.o. male who presents for the following: Annual Exam (Patient here today for skin check. Per patient he has a mole on his lower abdomen and on his front left scalp that his parents want looked at. Per patient there are two fatty tumors on his right flank and left flank that his parent's would like it checked. Patients father had a melanoma. His mother was in the room at the time of the visit.    The following portions of the chart were reviewed this encounter and updated as appropriate:  Tobacco  Allergies  Meds  Problems  Med Hx  Surg Hx  Fam Hx      Objective  Well appearing patient in no apparent distress; mood and affect are within normal limits.  A full examination was performed including scalp, head, eyes, ears, nose, lips, neck, chest, axillae, abdomen, back, buttocks, bilateral upper extremities, bilateral lower extremities, hands, feet, fingers, toes, fingernails, and toenails. All findings within normal limits unless otherwise noted below.  head to toe No atypical nevi No signs of non-mole skin cancer.    Assessment & Plan  Encounter for screening for malignant neoplasm of skin head to toe  Yearly skin examinations  Lentigines - Scattered tan macules - Discussed due to sun exposure - Benign, observe - Call for any changes   Melanocytic Nevi- scalp and lower abdomen - Tan-brown and/or pink-flesh-colored symmetric macules and papules - Benign appearing on exam today - Observation - Call clinic for new or changing moles - Recommend daily use of broad spectrum spf 30+ sunscreen to sun-exposed areas.    Skin cancer screening performed today.   I, Katalina Magri, PA-C, have reviewed all documentation's for this visit.  The documentation on 12/05/20 for the exam, diagnosis, procedures and orders are all accurate and complete.

## 2020-12-19 DIAGNOSIS — H52223 Regular astigmatism, bilateral: Secondary | ICD-10-CM | POA: Diagnosis not present

## 2020-12-19 DIAGNOSIS — H5203 Hypermetropia, bilateral: Secondary | ICD-10-CM | POA: Diagnosis not present

## 2020-12-22 DIAGNOSIS — M9903 Segmental and somatic dysfunction of lumbar region: Secondary | ICD-10-CM | POA: Diagnosis not present

## 2020-12-22 DIAGNOSIS — M5414 Radiculopathy, thoracic region: Secondary | ICD-10-CM | POA: Diagnosis not present

## 2020-12-22 DIAGNOSIS — M531 Cervicobrachial syndrome: Secondary | ICD-10-CM | POA: Diagnosis not present

## 2020-12-22 DIAGNOSIS — M9901 Segmental and somatic dysfunction of cervical region: Secondary | ICD-10-CM | POA: Diagnosis not present

## 2020-12-22 DIAGNOSIS — M5416 Radiculopathy, lumbar region: Secondary | ICD-10-CM | POA: Diagnosis not present

## 2020-12-22 DIAGNOSIS — M9902 Segmental and somatic dysfunction of thoracic region: Secondary | ICD-10-CM | POA: Diagnosis not present

## 2021-01-13 DIAGNOSIS — M9903 Segmental and somatic dysfunction of lumbar region: Secondary | ICD-10-CM | POA: Diagnosis not present

## 2021-01-13 DIAGNOSIS — M9902 Segmental and somatic dysfunction of thoracic region: Secondary | ICD-10-CM | POA: Diagnosis not present

## 2021-01-13 DIAGNOSIS — M531 Cervicobrachial syndrome: Secondary | ICD-10-CM | POA: Diagnosis not present

## 2021-01-13 DIAGNOSIS — M9901 Segmental and somatic dysfunction of cervical region: Secondary | ICD-10-CM | POA: Diagnosis not present

## 2021-01-13 DIAGNOSIS — M5414 Radiculopathy, thoracic region: Secondary | ICD-10-CM | POA: Diagnosis not present

## 2021-01-13 DIAGNOSIS — M5416 Radiculopathy, lumbar region: Secondary | ICD-10-CM | POA: Diagnosis not present

## 2021-01-16 DIAGNOSIS — F902 Attention-deficit hyperactivity disorder, combined type: Secondary | ICD-10-CM | POA: Diagnosis not present

## 2021-01-16 DIAGNOSIS — F419 Anxiety disorder, unspecified: Secondary | ICD-10-CM | POA: Diagnosis not present

## 2021-02-03 DIAGNOSIS — M531 Cervicobrachial syndrome: Secondary | ICD-10-CM | POA: Diagnosis not present

## 2021-02-03 DIAGNOSIS — M5414 Radiculopathy, thoracic region: Secondary | ICD-10-CM | POA: Diagnosis not present

## 2021-02-03 DIAGNOSIS — M9902 Segmental and somatic dysfunction of thoracic region: Secondary | ICD-10-CM | POA: Diagnosis not present

## 2021-02-03 DIAGNOSIS — M9903 Segmental and somatic dysfunction of lumbar region: Secondary | ICD-10-CM | POA: Diagnosis not present

## 2021-02-03 DIAGNOSIS — M9901 Segmental and somatic dysfunction of cervical region: Secondary | ICD-10-CM | POA: Diagnosis not present

## 2021-02-03 DIAGNOSIS — M5416 Radiculopathy, lumbar region: Secondary | ICD-10-CM | POA: Diagnosis not present

## 2021-02-13 ENCOUNTER — Telehealth: Payer: Self-pay

## 2021-02-13 NOTE — Chronic Care Management (AMB) (Signed)
  Chronic Care Management   Note  02/13/2021 Name: Taylor Hines MRN: [id number] DOB: 14-Nov-1985  Taylor Hines is a 35 y.o. year old male who is a primary care patient of Leamon Arnt, MD. I reached out to Madelyn Flavors by phone today in response to a referral sent by Mr. Jones PCP.  Mr. Dougal was given information about Chronic Care Management services today including:  CCM service includes personalized support from designated clinical staff supervised by his physician, including individualized plan of care and coordination with other care providers 24/7 contact phone numbers for assistance for urgent and routine care needs. Service will only be billed when office clinical staff spend 20 minutes or more in a month to coordinate care. Only one practitioner may furnish and bill the service in a calendar month. The patient may stop CCM services at any time (effective at the end of the month) by phone call to the office staff. The patient is responsible for co-pay (up to 20% after annual deductible is met) if co-pay is required by the individual health plan.   Patient did not agree to enrollment in care management services and does not wish to consider at this time.  Follow up plan: Patient declines  engagement by the care management team. Appropriate care team members and provider have been notified via electronic communication.   Noreene Larsson, [place], [place], [place] [address] Direct Dial: [phone] [email or web address] [email or web address]

## 2021-02-24 DIAGNOSIS — M9902 Segmental and somatic dysfunction of thoracic region: Secondary | ICD-10-CM | POA: Diagnosis not present

## 2021-02-24 DIAGNOSIS — M9901 Segmental and somatic dysfunction of cervical region: Secondary | ICD-10-CM | POA: Diagnosis not present

## 2021-02-24 DIAGNOSIS — M9903 Segmental and somatic dysfunction of lumbar region: Secondary | ICD-10-CM | POA: Diagnosis not present

## 2021-02-24 DIAGNOSIS — M5414 Radiculopathy, thoracic region: Secondary | ICD-10-CM | POA: Diagnosis not present

## 2021-02-24 DIAGNOSIS — M531 Cervicobrachial syndrome: Secondary | ICD-10-CM | POA: Diagnosis not present

## 2021-02-24 DIAGNOSIS — M5416 Radiculopathy, lumbar region: Secondary | ICD-10-CM | POA: Diagnosis not present

## 2021-03-17 DIAGNOSIS — M9901 Segmental and somatic dysfunction of cervical region: Secondary | ICD-10-CM | POA: Diagnosis not present

## 2021-03-17 DIAGNOSIS — M9903 Segmental and somatic dysfunction of lumbar region: Secondary | ICD-10-CM | POA: Diagnosis not present

## 2021-03-17 DIAGNOSIS — M5414 Radiculopathy, thoracic region: Secondary | ICD-10-CM | POA: Diagnosis not present

## 2021-03-17 DIAGNOSIS — M5416 Radiculopathy, lumbar region: Secondary | ICD-10-CM | POA: Diagnosis not present

## 2021-03-17 DIAGNOSIS — M531 Cervicobrachial syndrome: Secondary | ICD-10-CM | POA: Diagnosis not present

## 2021-03-17 DIAGNOSIS — M9902 Segmental and somatic dysfunction of thoracic region: Secondary | ICD-10-CM | POA: Diagnosis not present

## 2021-03-22 ENCOUNTER — Ambulatory Visit: Payer: PPO | Admitting: Physician Assistant

## 2021-04-07 DIAGNOSIS — M9903 Segmental and somatic dysfunction of lumbar region: Secondary | ICD-10-CM | POA: Diagnosis not present

## 2021-04-07 DIAGNOSIS — M9902 Segmental and somatic dysfunction of thoracic region: Secondary | ICD-10-CM | POA: Diagnosis not present

## 2021-04-07 DIAGNOSIS — M5416 Radiculopathy, lumbar region: Secondary | ICD-10-CM | POA: Diagnosis not present

## 2021-04-07 DIAGNOSIS — M531 Cervicobrachial syndrome: Secondary | ICD-10-CM | POA: Diagnosis not present

## 2021-04-07 DIAGNOSIS — M9901 Segmental and somatic dysfunction of cervical region: Secondary | ICD-10-CM | POA: Diagnosis not present

## 2021-04-07 DIAGNOSIS — M5414 Radiculopathy, thoracic region: Secondary | ICD-10-CM | POA: Diagnosis not present

## 2021-06-23 DIAGNOSIS — M5414 Radiculopathy, thoracic region: Secondary | ICD-10-CM | POA: Diagnosis not present

## 2021-06-23 DIAGNOSIS — M9902 Segmental and somatic dysfunction of thoracic region: Secondary | ICD-10-CM | POA: Diagnosis not present

## 2021-06-23 DIAGNOSIS — M9903 Segmental and somatic dysfunction of lumbar region: Secondary | ICD-10-CM | POA: Diagnosis not present

## 2021-06-23 DIAGNOSIS — M5416 Radiculopathy, lumbar region: Secondary | ICD-10-CM | POA: Diagnosis not present

## 2021-06-23 DIAGNOSIS — M9901 Segmental and somatic dysfunction of cervical region: Secondary | ICD-10-CM | POA: Diagnosis not present

## 2021-06-23 DIAGNOSIS — M531 Cervicobrachial syndrome: Secondary | ICD-10-CM | POA: Diagnosis not present

## 2021-07-03 DIAGNOSIS — F902 Attention-deficit hyperactivity disorder, combined type: Secondary | ICD-10-CM | POA: Diagnosis not present

## 2021-07-03 DIAGNOSIS — F419 Anxiety disorder, unspecified: Secondary | ICD-10-CM | POA: Diagnosis not present

## 2021-07-21 DIAGNOSIS — M5416 Radiculopathy, lumbar region: Secondary | ICD-10-CM | POA: Diagnosis not present

## 2021-07-21 DIAGNOSIS — M5414 Radiculopathy, thoracic region: Secondary | ICD-10-CM | POA: Diagnosis not present

## 2021-07-21 DIAGNOSIS — M9902 Segmental and somatic dysfunction of thoracic region: Secondary | ICD-10-CM | POA: Diagnosis not present

## 2021-07-21 DIAGNOSIS — M9901 Segmental and somatic dysfunction of cervical region: Secondary | ICD-10-CM | POA: Diagnosis not present

## 2021-07-21 DIAGNOSIS — M9903 Segmental and somatic dysfunction of lumbar region: Secondary | ICD-10-CM | POA: Diagnosis not present

## 2021-07-21 DIAGNOSIS — M531 Cervicobrachial syndrome: Secondary | ICD-10-CM | POA: Diagnosis not present

## 2021-08-01 ENCOUNTER — Encounter: Payer: Self-pay | Admitting: Family Medicine

## 2021-08-01 ENCOUNTER — Ambulatory Visit (INDEPENDENT_AMBULATORY_CARE_PROVIDER_SITE_OTHER): Payer: PPO | Admitting: Family Medicine

## 2021-08-01 VITALS — BP 132/78 | Temp 98.3°F | Ht 72.0 in | Wt 239.4 lb

## 2021-08-01 DIAGNOSIS — J029 Acute pharyngitis, unspecified: Secondary | ICD-10-CM

## 2021-08-01 DIAGNOSIS — J069 Acute upper respiratory infection, unspecified: Secondary | ICD-10-CM

## 2021-08-01 LAB — POC COVID19 BINAXNOW: SARS Coronavirus 2 Ag: NEGATIVE

## 2021-08-01 LAB — POCT RAPID STREP A (OFFICE): Rapid Strep A Screen: NEGATIVE

## 2021-08-01 MED ORDER — ALBUTEROL SULFATE HFA 108 (90 BASE) MCG/ACT IN AERS: 2.0000 | INHALATION_SPRAY | Freq: Four times a day (QID) | RESPIRATORY_TRACT | 1 refills | Status: DC | PRN

## 2021-08-01 NOTE — Patient Instructions (Addendum)
Take dayquil/nyquil/vicks.   ? ?Worse, new findings, let us know.  Use albuterol if having trouble breathing ?

## 2021-08-01 NOTE — Progress Notes (Signed)
? ?Subjective:  ? ? ? Patient ID: Taylor Hines, male    DOB: March 25, 1986, 36 y.o.   MRN: [id number] ? ?Chief Complaint  ?Patient presents with  ? possible strep  ? Cough  ? Nasal Congestion  ? Sore Throat  ? ? ?HPI ?Chief complaint: sore throat,congestion ?Symptom onset: Sunday ?Pertinent positives: sore throat, congestion, some cough ?Pertinent negatives: no f/c.  Swabbed Monday for covid ?Treatments tried: dayquil, delsym, vick's vabor rub,nyquil ?Vaccine status: utd ?Sick exposure: none  ? ?There are no preventive care reminders to display for this patient. ? ? ?Past Medical History:  ?Diagnosis Date  ? Allergy   ? Asthma   ? Autism   ? Depression   ? Essential hypertension 02/26/2019  ? Family history of malignant melanoma - father 02/26/2019  ? History of chickenpox   ? Hypertension   ? Tachycardia   ? ? ?Past Surgical History:  ?Procedure Laterality Date  ? EXTERNAL EAR SURGERY    ? TIBIA FRACTURE SURGERY Left 2020  ? tib/fib fx-plate.  in [place]  ? TONSILLECTOMY AND ADENOIDECTOMY    ? TYMPANOSTOMY TUBE PLACEMENT    ? ? ?Outpatient Medications Prior to Visit  ?Medication Sig Dispense Refill  ? acetaminophen (TYLENOL) 500 MG tablet Take 500 mg by mouth 2 (two) times daily.    ? albuterol (PROVENTIL HFA;VENTOLIN HFA) 108 (90 Base) MCG/ACT inhaler Inhale 2 puffs into the lungs every 6 (six) hours as needed for wheezing or shortness of breath.    ? citalopram (CELEXA) 10 MG tablet Take 10 mg by mouth daily.    ? citalopram (CELEXA) 40 MG tablet     ? fluticasone (FLONASE) 50 MCG/ACT nasal spray Place 2 sprays into both nostrils daily.    ? lisinopril (ZESTRIL) 20 MG tablet Take 1 tablet (20 mg total) by mouth daily. 90 tablet 3  ? Melatonin 10 MG TABS Take 1 tablet by mouth daily.    ? methylphenidate (RITALIN LA) 20 MG 24 hr capsule Take 60 mg by mouth every morning.    ? Multiple Vitamin (MULTIVITAMIN) tablet Take 1 tablet by mouth daily. (Patient not taking: Reported on 08/01/2021)    ? ?No  facility-administered medications prior to visit.  ? ? ?Allergies  ?Allergen Reactions  ? Bactrim [Sulfamethoxazole-Trimethoprim] Hives and Swelling  ? Multihance [Gadobenate] Shortness Of Breath, Swelling and Cough  ?  50 mg of benadryl given at 345. Coughing, difficulty swallowing and breathing. Sneezing.  ? Nsaids Swelling  ? Other Swelling  ? Contrast Media [Iodinated Contrast Media]   ? Nickel Rash  ? ?ROS neg/noncontributory except as noted HPI/below ? ? ?   ?Objective:  ?  ? ?BP 132/78 (BP Location: Left Arm, Patient Position: Sitting, Cuff Size: Large)   Temp 98.3 ?F (36.8 ?C) (Temporal)   Ht 6' (1.829 m)   Wt 239 lb 6.4 oz (108.6 kg)   SpO2 96%   BMI 32.47 kg/m?  ?Wt Readings from Last 3 Encounters:  ?08/01/21 239 lb 6.4 oz (108.6 kg)  ?10/19/20 228 lb 8 oz (103.6 kg)  ?02/26/19 205 lb 3.2 oz (93.1 kg)  ? ? ?Physical Exam  ? ?Gen: WDWN NAD OWM ?HEENT: NCAT, conjunctiva not injected, sclera nonicteric ?TM WNL B, OP moist, no exudates. Sl red .  congested ?NECK:  supple, no thyromegaly, no nodes, no carotid bruits ?CARDIAC: RRR, S1S2+, no murmur.  ?LUNGS: CTAB. No wheezes ?EXT:  no edema ?MSK: no gross abnormalities.  ?NEURO: A&O x3.  CN II-XII intact.  ?  PSYCH: normal mood. Good eye contact ? ?Results for orders placed or performed in visit on 08/01/21  ?POCT rapid strep A  ?Result Value Ref Range  ? Rapid Strep A Screen Negative Negative  ?[place] COVID-19  ?Result Value Ref Range  ? SARS Coronavirus 2 Ag Negative Negative  ?  ? ? ? ?   ?Assessment & Plan:  ? ?Problem List Items Addressed This Visit   ?None ?Visit Diagnoses   ? ? Sore throat    -  Primary  ? Relevant Orders  ? POCT rapid strep A (Completed)  ? Viral upper respiratory tract infection      ? ?  ? URI-symptomatic tx.  Worse, new findings, let us know.  Albuterol if needed ?Spoke to Dad in lobby-Dad seen by his pcp and dx covid.  Advised to keep [place] home for the rest of week in case false negative covid.  Prob just URI ? ?No orders of the  defined types were placed in this encounter. ? ? ?Wellington Hampshire, MD ? ?

## 2021-08-25 DIAGNOSIS — M9901 Segmental and somatic dysfunction of cervical region: Secondary | ICD-10-CM | POA: Diagnosis not present

## 2021-08-25 DIAGNOSIS — M9902 Segmental and somatic dysfunction of thoracic region: Secondary | ICD-10-CM | POA: Diagnosis not present

## 2021-08-25 DIAGNOSIS — M531 Cervicobrachial syndrome: Secondary | ICD-10-CM | POA: Diagnosis not present

## 2021-08-25 DIAGNOSIS — M9903 Segmental and somatic dysfunction of lumbar region: Secondary | ICD-10-CM | POA: Diagnosis not present

## 2021-08-25 DIAGNOSIS — M5414 Radiculopathy, thoracic region: Secondary | ICD-10-CM | POA: Diagnosis not present

## 2021-08-25 DIAGNOSIS — M5416 Radiculopathy, lumbar region: Secondary | ICD-10-CM | POA: Diagnosis not present

## 2021-08-31 ENCOUNTER — Encounter: Payer: Self-pay | Admitting: Family Medicine

## 2021-08-31 ENCOUNTER — Ambulatory Visit (INDEPENDENT_AMBULATORY_CARE_PROVIDER_SITE_OTHER): Payer: PPO | Admitting: Family Medicine

## 2021-08-31 VITALS — BP 130/73 | HR 123 | Temp 97.2°F | Ht 72.0 in | Wt 241.4 lb

## 2021-08-31 DIAGNOSIS — R052 Subacute cough: Secondary | ICD-10-CM

## 2021-08-31 MED ORDER — LOSARTAN POTASSIUM 50 MG PO TABS: 50.0000 mg | ORAL_TABLET | Freq: Every day | ORAL | 1 refills | Status: DC

## 2021-08-31 MED ORDER — PREDNISONE 20 MG PO TABS: 40.0000 mg | ORAL_TABLET | Freq: Every day | ORAL | 0 refills | Status: AC

## 2021-08-31 MED ORDER — QVAR REDIHALER 80 MCG/ACT IN AERB: 1.0000 | INHALATION_SPRAY | Freq: Two times a day (BID) | RESPIRATORY_TRACT | 3 refills | Status: DC

## 2021-08-31 MED ORDER — AZITHROMYCIN 250 MG PO TABS: ORAL_TABLET | ORAL | 0 refills | Status: AC

## 2021-08-31 MED ORDER — BENZONATATE 100 MG PO CAPS: 100.0000 mg | ORAL_CAPSULE | Freq: Three times a day (TID) | ORAL | 0 refills | Status: DC | PRN

## 2021-08-31 MED ORDER — ALBUTEROL SULFATE (2.5 MG/3ML) 0.083% IN NEBU: 2.5000 mg | INHALATION_SOLUTION | Freq: Four times a day (QID) | RESPIRATORY_TRACT | 1 refills | Status: DC | PRN

## 2021-08-31 NOTE — Progress Notes (Signed)
? ?Subjective:  ? ? ? Patient ID: Taylor Hines, male    DOB: [birth year], 36 y.o.   MRN: [id number] ? ?Chief Complaint  ?Patient presents with  ? Cough  ?  Pt c/o chronic cough for several weeks; pt also has asthma as well, dry cough and non productive; Delsum, cough drops, Vicks, been using inhaler as well. Negative Covid and negative strep  ? ? ?HPI ?Seen 08/01/21 for viral infection.  No meds x albuterol-Dad dx that same day w/covid.  Rechecked covid and home twice more and neg.  ?Still coughing-mostly dry. OTC and albuterol-helps some.  Qvar in past but not compliant w/it.   No sob.  No f/c.  Also on Lisinopril for HTN ? ?Health Maintenance Due  ?Topic Date Due  ? COVID-19 Vaccine (4 - Booster for Pfizer series) 06/29/2020  ? ? ?Past Medical History:  ?Diagnosis Date  ? Allergy   ? Asthma   ? Autism   ? Depression   ? Essential hypertension 02/26/2019  ? Family history of malignant melanoma - father 02/26/2019  ? History of chickenpox   ? Hypertension   ? Tachycardia   ? ? ?Past Surgical History:  ?Procedure Laterality Date  ? EXTERNAL EAR SURGERY    ? TIBIA FRACTURE SURGERY Left 2020  ? tib[place]fib fx-plate.  in [place]  ? TONSILLECTOMY AND ADENOIDECTOMY    ? TYMPANOSTOMY TUBE PLACEMENT    ? ? ?Outpatient Medications Prior to Visit  ?Medication Sig Dispense Refill  ? acetaminophen (TYLENOL) 500 MG tablet Take 500 mg by mouth 2 (two) times daily.    ? albuterol (VENTOLIN HFA) 108 (90 Base) MCG/ACT inhaler Inhale 2 puffs into the lungs every 6 (six) hours as needed for wheezing or shortness of breath. 1 each 1  ? citalopram (CELEXA) 10 MG tablet Take 10 mg by mouth daily.    ? citalopram (CELEXA) 40 MG tablet     ? fluticasone (FLONASE) 50 MCG/ACT nasal spray Place 2 sprays into both nostrils daily.    ? lisinopril (ZESTRIL) 20 MG tablet Take 1 tablet (20 mg total) by mouth daily. 90 tablet 3  ? Melatonin 10 MG TABS Take 1 tablet by mouth daily.    ? methylphenidate (RITALIN LA) 20 MG 24 hr capsule Take 60 mg by mouth  every morning.    ? Multiple Vitamin (MULTIVITAMIN) tablet Take 1 tablet by mouth daily. (Patient not taking: Reported on 08/31/2021)    ? ?No facility-administered medications prior to visit.  ? ? ?Allergies  ?Allergen Reactions  ? Bactrim [Sulfamethoxazole-Trimethoprim] Hives and Swelling  ? Multihance [Gadobenate] Shortness Of Breath, Swelling and Cough  ?  50 mg of benadryl given at 345. Coughing, difficulty swallowing and breathing. Sneezing.  ? Nsaids Swelling  ? Other Swelling  ? Contrast Media [Iodinated Contrast Media]   ? Nickel Rash  ? ?ROS neg/noncontributory except as noted HPI/below ? ? ?   ?Objective:  ?  ? ?BP 130/73 (BP Location: Left Arm)   Pulse (!) 123   Temp (!) 97.2 ?F (36.2 ?C) (Temporal)   Ht 6' (1.829 m)   Wt 241 lb 6.4 oz (109.5 kg)   SpO2 98%   BMI 32.74 kg/m?  ?Wt Readings from Last 3 Encounters:  ?08/31/21 241 lb 6.4 oz (109.5 kg)  ?08/01/21 239 lb 6.4 oz (108.6 kg)  ?10/19/20 228 lb 8 oz (103.6 kg)  ? ? ?Physical Exam  ? ?Gen: WDWN NAD om ?HEENT: NCAT, conjunctiva not injected, sclera nonicteric ?TM  WNL B, OP moist, no exudates  ?NECK:  supple, no thyromegaly, no nodes, no carotid bruits ?CARDIAC: tachy RRR, S1S2+, no murmur. DP 2+B ?LUNGS: CTAB. No wheezes.  Coughing a lot ?EXT:  no edema ?MSK: no gross abnormalities.  ?NEURO: A&O x3.  CN II-XII intact.  ?PSYCH: normal mood. Good eye contact ? ?   ?Assessment & Plan:  ? ?Problem List Items Addressed This Visit   ?None ?Visit Diagnoses   ? ? Subacute cough    -  Primary  ? ?  ? Cough-?asthma flare, post infectious,lisinopril, other- will change lisinopril to losartan, do pred/zpk.   Albuterol nebs.  Tessalon perles. .   Add qvar.   F/u no change, worse. ? ?No orders of the defined types were placed in this encounter. ? ? ?Wellington Hampshire, MD ? ?

## 2021-08-31 NOTE — Patient Instructions (Signed)
It was very nice to see you today! ? ?Stop lisinopril-and start losartan-monitor blood pressures.  ? ? ? ?PLEASE NOTE: ? ?If you had any lab tests please let us know if you have not heard back within a few days. You may see your results on MyChart before we have a chance to review them but we will give you a call once they are reviewed by [place]. If we ordered any referrals today, please let us know if you have not heard from their office within the next week.  ? ?Please try these tips to maintain a healthy lifestyle: ? ?Eat most of your calories during the day when you are active. Eliminate processed foods including packaged sweets (pies, cakes, cookies), reduce intake of potatoes, white bread, white pasta, and white rice. Look for whole grain options, oat flour or almond flour. ? ?Each meal should contain half fruits/vegetables, one quarter protein, and one quarter carbs (no bigger than a computer mouse). ? ?Cut down on sweet beverages. This includes juice, soda, and sweet tea. Also watch fruit intake, though this is a healthier sweet option, it still contains natural sugar! Limit to 3 servings daily. ? ?Drink at least 1 glass of water with each meal and aim for at least 8 glasses per day ? ?Exercise at least 150 minutes every week.   ?

## 2021-09-05 ENCOUNTER — Ambulatory Visit: Payer: PPO | Admitting: Physician Assistant

## 2021-09-05 ENCOUNTER — Encounter: Payer: Self-pay | Admitting: Physician Assistant

## 2021-09-05 DIAGNOSIS — Z1283 Encounter for screening for malignant neoplasm of skin: Secondary | ICD-10-CM | POA: Diagnosis not present

## 2021-09-05 DIAGNOSIS — Z86018 Personal history of other benign neoplasm: Secondary | ICD-10-CM

## 2021-09-06 ENCOUNTER — Telehealth: Payer: PPO | Admitting: *Deleted

## 2021-09-06 NOTE — Telephone Encounter (Signed)
Spoke with patient's father and he asked if Albuterol inhaler could be sent in. ?

## 2021-09-06 NOTE — Telephone Encounter (Signed)
Left message for patient to return my call.

## 2021-09-06 NOTE — Telephone Encounter (Signed)
Pharmacy faxed over form stating that plan does not cover medication. Alternative requested or PA for Qvar redihaler 80 mcg will need to be done. Please advise. ?

## 2021-09-07 ENCOUNTER — Telehealth: Payer: Self-pay | Admitting: Family Medicine

## 2021-09-07 MED ORDER — ASMANEX (60 METERED DOSES) 220 MCG/ACT IN AEPB: 2.0000 | INHALATION_SPRAY | Freq: Every day | RESPIRATORY_TRACT | 1 refills | Status: DC

## 2021-09-07 NOTE — Telephone Encounter (Signed)
Spoke with insurance company and both [place] and flovent are covered.  ?

## 2021-09-07 NOTE — Addendum Note (Signed)
Addended by: Wellington Hampshire on: 09/07/2021 09:03 PM ? ? Modules accepted: Orders ? ?

## 2021-09-07 NOTE — Telephone Encounter (Signed)
Patients father states Ins wont pay for qvar inhaler- patient stated his pharmacy sent over a pa form - pt would like office to reach out to have this approved.-  ?

## 2021-09-08 DIAGNOSIS — R058 Other specified cough: Secondary | ICD-10-CM | POA: Diagnosis not present

## 2021-09-08 DIAGNOSIS — R059 Cough, unspecified: Secondary | ICD-10-CM | POA: Diagnosis not present

## 2021-09-08 DIAGNOSIS — I10 Essential (primary) hypertension: Secondary | ICD-10-CM | POA: Diagnosis not present

## 2021-09-10 ENCOUNTER — Encounter: Payer: Self-pay | Admitting: Physician Assistant

## 2021-09-10 NOTE — Progress Notes (Signed)
? ?  Follow-Up Visit ?  ?Subjective  ?Taylor Hines is a 36 y.o. male who presents for the following: Annual Exam (Patient here today with his mom for a skin check no new concerns. Personal history of atypical mole. ). ? ? ?The following portions of the chart were reviewed this encounter and updated as appropriate:  Tobacco  Allergies  Meds  Problems  Med Hx  Surg Hx  Fam Hx   ?  ? ?Objective  ?Well appearing patient in no apparent distress; mood and affect are within normal limits. ? ?A full examination was performed including scalp, head, eyes, ears, nose, lips, neck, chest, axillae, abdomen, back, buttocks, bilateral upper extremities, bilateral lower extremities, hands, feet, fingers, toes, fingernails, and toenails. All findings within normal limits unless otherwise noted below. ? ?waist up ?No atypical nevi No signs of non-mole skin cancer.  ? ? ?Assessment & Plan  ?Encounter for screening for malignant neoplasm of skin ?waist up ? ?Yearly skin examinations ? ? ? ?I, Avice Funchess, PA-C, have reviewed all documentation's for this visit.  The documentation on 09/10/21 for the exam, diagnosis, procedures and orders are all accurate and complete. ?

## 2021-09-12 NOTE — Telephone Encounter (Signed)
[facility] and spoke with [place]. PA for QVAR started, pending decision. [place] stated that office would get a fax. ?

## 2021-09-12 NOTE — Telephone Encounter (Signed)
Called and spoke to [place] with [facility]. PA started. Pending decision. ? ? ?

## 2021-09-15 DIAGNOSIS — M531 Cervicobrachial syndrome: Secondary | ICD-10-CM | POA: Diagnosis not present

## 2021-09-15 DIAGNOSIS — M5414 Radiculopathy, thoracic region: Secondary | ICD-10-CM | POA: Diagnosis not present

## 2021-09-15 DIAGNOSIS — M9903 Segmental and somatic dysfunction of lumbar region: Secondary | ICD-10-CM | POA: Diagnosis not present

## 2021-09-15 DIAGNOSIS — M5416 Radiculopathy, lumbar region: Secondary | ICD-10-CM | POA: Diagnosis not present

## 2021-09-15 DIAGNOSIS — M9902 Segmental and somatic dysfunction of thoracic region: Secondary | ICD-10-CM | POA: Diagnosis not present

## 2021-09-15 DIAGNOSIS — M9901 Segmental and somatic dysfunction of cervical region: Secondary | ICD-10-CM | POA: Diagnosis not present

## 2021-09-19 NOTE — Telephone Encounter (Signed)
[employer] sent a form requesting information about previous medications tried. Form completed and faxed back on 09/19/2021.  ?

## 2021-09-20 NOTE — Telephone Encounter (Signed)
Spoke with [employer] on 09/19/2021 concerning medications tried and failed after fax was sent over. Still waiting on decision. ?

## 2021-09-24 ENCOUNTER — Telehealth: Payer: Self-pay

## 2021-09-24 NOTE — Telephone Encounter (Signed)
PA has been approved thru [employer] for  Qvar Redihaler 24mg.  ? ?Start date 09/20/2021-05/19/2022 ?

## 2021-10-06 DIAGNOSIS — M5414 Radiculopathy, thoracic region: Secondary | ICD-10-CM | POA: Diagnosis not present

## 2021-10-06 DIAGNOSIS — M9902 Segmental and somatic dysfunction of thoracic region: Secondary | ICD-10-CM | POA: Diagnosis not present

## 2021-10-06 DIAGNOSIS — M9901 Segmental and somatic dysfunction of cervical region: Secondary | ICD-10-CM | POA: Diagnosis not present

## 2021-10-06 DIAGNOSIS — M5416 Radiculopathy, lumbar region: Secondary | ICD-10-CM | POA: Diagnosis not present

## 2021-10-06 DIAGNOSIS — M531 Cervicobrachial syndrome: Secondary | ICD-10-CM | POA: Diagnosis not present

## 2021-10-06 DIAGNOSIS — M9903 Segmental and somatic dysfunction of lumbar region: Secondary | ICD-10-CM | POA: Diagnosis not present

## 2021-10-09 DIAGNOSIS — F902 Attention-deficit hyperactivity disorder, combined type: Secondary | ICD-10-CM | POA: Diagnosis not present

## 2021-10-09 DIAGNOSIS — F419 Anxiety disorder, unspecified: Secondary | ICD-10-CM | POA: Diagnosis not present

## 2021-10-27 DIAGNOSIS — M9902 Segmental and somatic dysfunction of thoracic region: Secondary | ICD-10-CM | POA: Diagnosis not present

## 2021-10-27 DIAGNOSIS — M9901 Segmental and somatic dysfunction of cervical region: Secondary | ICD-10-CM | POA: Diagnosis not present

## 2021-10-27 DIAGNOSIS — M5414 Radiculopathy, thoracic region: Secondary | ICD-10-CM | POA: Diagnosis not present

## 2021-10-27 DIAGNOSIS — M9903 Segmental and somatic dysfunction of lumbar region: Secondary | ICD-10-CM | POA: Diagnosis not present

## 2021-10-27 DIAGNOSIS — M5416 Radiculopathy, lumbar region: Secondary | ICD-10-CM | POA: Diagnosis not present

## 2021-10-27 DIAGNOSIS — M531 Cervicobrachial syndrome: Secondary | ICD-10-CM | POA: Diagnosis not present

## 2021-11-21 ENCOUNTER — Other Ambulatory Visit: Payer: Self-pay | Admitting: Family Medicine

## 2021-12-18 DIAGNOSIS — M5416 Radiculopathy, lumbar region: Secondary | ICD-10-CM | POA: Diagnosis not present

## 2021-12-18 DIAGNOSIS — M5414 Radiculopathy, thoracic region: Secondary | ICD-10-CM | POA: Diagnosis not present

## 2021-12-18 DIAGNOSIS — J45909 Unspecified asthma, uncomplicated: Secondary | ICD-10-CM | POA: Diagnosis not present

## 2021-12-18 DIAGNOSIS — M9901 Segmental and somatic dysfunction of cervical region: Secondary | ICD-10-CM | POA: Diagnosis not present

## 2021-12-18 DIAGNOSIS — M9902 Segmental and somatic dysfunction of thoracic region: Secondary | ICD-10-CM | POA: Diagnosis not present

## 2021-12-18 DIAGNOSIS — M531 Cervicobrachial syndrome: Secondary | ICD-10-CM | POA: Diagnosis not present

## 2021-12-18 DIAGNOSIS — F900 Attention-deficit hyperactivity disorder, predominantly inattentive type: Secondary | ICD-10-CM | POA: Diagnosis not present

## 2021-12-18 DIAGNOSIS — I10 Essential (primary) hypertension: Secondary | ICD-10-CM | POA: Diagnosis not present

## 2021-12-18 DIAGNOSIS — G4700 Insomnia, unspecified: Secondary | ICD-10-CM | POA: Diagnosis not present

## 2021-12-18 DIAGNOSIS — J309 Allergic rhinitis, unspecified: Secondary | ICD-10-CM | POA: Diagnosis not present

## 2021-12-18 DIAGNOSIS — K5900 Constipation, unspecified: Secondary | ICD-10-CM | POA: Diagnosis not present

## 2021-12-18 DIAGNOSIS — F411 Generalized anxiety disorder: Secondary | ICD-10-CM | POA: Diagnosis not present

## 2021-12-18 DIAGNOSIS — M9903 Segmental and somatic dysfunction of lumbar region: Secondary | ICD-10-CM | POA: Diagnosis not present

## 2021-12-18 DIAGNOSIS — F840 Autistic disorder: Secondary | ICD-10-CM | POA: Diagnosis not present

## 2021-12-18 DIAGNOSIS — G8929 Other chronic pain: Secondary | ICD-10-CM | POA: Diagnosis not present

## 2021-12-18 DIAGNOSIS — F3342 Major depressive disorder, recurrent, in full remission: Secondary | ICD-10-CM | POA: Diagnosis not present

## 2021-12-18 DIAGNOSIS — E669 Obesity, unspecified: Secondary | ICD-10-CM | POA: Diagnosis not present

## 2021-12-18 DIAGNOSIS — F419 Anxiety disorder, unspecified: Secondary | ICD-10-CM | POA: Diagnosis not present

## 2022-01-08 DIAGNOSIS — F902 Attention-deficit hyperactivity disorder, combined type: Secondary | ICD-10-CM | POA: Diagnosis not present

## 2022-01-08 DIAGNOSIS — F419 Anxiety disorder, unspecified: Secondary | ICD-10-CM | POA: Diagnosis not present

## 2022-01-12 DIAGNOSIS — M5414 Radiculopathy, thoracic region: Secondary | ICD-10-CM | POA: Diagnosis not present

## 2022-01-12 DIAGNOSIS — M9902 Segmental and somatic dysfunction of thoracic region: Secondary | ICD-10-CM | POA: Diagnosis not present

## 2022-01-12 DIAGNOSIS — M9903 Segmental and somatic dysfunction of lumbar region: Secondary | ICD-10-CM | POA: Diagnosis not present

## 2022-01-12 DIAGNOSIS — M5416 Radiculopathy, lumbar region: Secondary | ICD-10-CM | POA: Diagnosis not present

## 2022-01-12 DIAGNOSIS — M531 Cervicobrachial syndrome: Secondary | ICD-10-CM | POA: Diagnosis not present

## 2022-01-12 DIAGNOSIS — M9901 Segmental and somatic dysfunction of cervical region: Secondary | ICD-10-CM | POA: Diagnosis not present

## 2022-02-02 DIAGNOSIS — M5414 Radiculopathy, thoracic region: Secondary | ICD-10-CM | POA: Diagnosis not present

## 2022-02-02 DIAGNOSIS — M531 Cervicobrachial syndrome: Secondary | ICD-10-CM | POA: Diagnosis not present

## 2022-02-02 DIAGNOSIS — M9901 Segmental and somatic dysfunction of cervical region: Secondary | ICD-10-CM | POA: Diagnosis not present

## 2022-02-02 DIAGNOSIS — M9902 Segmental and somatic dysfunction of thoracic region: Secondary | ICD-10-CM | POA: Diagnosis not present

## 2022-02-02 DIAGNOSIS — M5416 Radiculopathy, lumbar region: Secondary | ICD-10-CM | POA: Diagnosis not present

## 2022-02-02 DIAGNOSIS — M9903 Segmental and somatic dysfunction of lumbar region: Secondary | ICD-10-CM | POA: Diagnosis not present

## 2022-02-11 ENCOUNTER — Encounter: Payer: Self-pay | Admitting: *Deleted

## 2022-02-26 ENCOUNTER — Ambulatory Visit: Payer: PPO

## 2022-03-02 DIAGNOSIS — M531 Cervicobrachial syndrome: Secondary | ICD-10-CM | POA: Diagnosis not present

## 2022-03-02 DIAGNOSIS — M5414 Radiculopathy, thoracic region: Secondary | ICD-10-CM | POA: Diagnosis not present

## 2022-03-02 DIAGNOSIS — M9902 Segmental and somatic dysfunction of thoracic region: Secondary | ICD-10-CM | POA: Diagnosis not present

## 2022-03-02 DIAGNOSIS — M9903 Segmental and somatic dysfunction of lumbar region: Secondary | ICD-10-CM | POA: Diagnosis not present

## 2022-03-02 DIAGNOSIS — M9901 Segmental and somatic dysfunction of cervical region: Secondary | ICD-10-CM | POA: Diagnosis not present

## 2022-03-02 DIAGNOSIS — M5416 Radiculopathy, lumbar region: Secondary | ICD-10-CM | POA: Diagnosis not present

## 2022-03-06 ENCOUNTER — Ambulatory Visit (INDEPENDENT_AMBULATORY_CARE_PROVIDER_SITE_OTHER): Payer: PPO

## 2022-03-06 DIAGNOSIS — Z23 Encounter for immunization: Secondary | ICD-10-CM

## 2022-03-07 ENCOUNTER — Other Ambulatory Visit: Payer: Self-pay | Admitting: Family Medicine

## 2022-03-08 ENCOUNTER — Telehealth: Payer: Self-pay | Admitting: Family Medicine

## 2022-03-08 NOTE — Telephone Encounter (Signed)
LVM for brad to rtn my call to schedule AWV-I with [facility]. Please schedule this appt if brad calls the office.

## 2022-03-12 ENCOUNTER — Encounter: Payer: Self-pay | Admitting: Family Medicine

## 2022-03-12 ENCOUNTER — Ambulatory Visit (INDEPENDENT_AMBULATORY_CARE_PROVIDER_SITE_OTHER): Payer: PPO | Admitting: Family Medicine

## 2022-03-12 VITALS — BP 118/70 | HR 106 | Temp 97.7°F | Ht 72.0 in | Wt 243.4 lb

## 2022-03-12 DIAGNOSIS — I10 Essential (primary) hypertension: Secondary | ICD-10-CM

## 2022-03-12 DIAGNOSIS — J302 Other seasonal allergic rhinitis: Secondary | ICD-10-CM | POA: Diagnosis not present

## 2022-03-12 DIAGNOSIS — J4530 Mild persistent asthma, uncomplicated: Secondary | ICD-10-CM

## 2022-03-12 DIAGNOSIS — F339 Major depressive disorder, recurrent, unspecified: Secondary | ICD-10-CM | POA: Diagnosis not present

## 2022-03-12 MED ORDER — LOSARTAN POTASSIUM 50 MG PO TABS
ORAL_TABLET | ORAL | 3 refills | Status: DC
Start: 2022-03-12 — End: 2023-03-24

## 2022-03-12 MED ORDER — FLUTICASONE PROPIONATE 50 MCG/ACT NA SUSP: 1.0000 | Freq: Every day | NASAL | 11 refills | Status: DC

## 2022-03-12 NOTE — Patient Instructions (Addendum)
Please return for your complete physical.  I have refilled your medications. Treating your allergies will help your throat. You may add zyrtec or allegra to the flonase if needed.  Good seeing you!  If you have any questions or concerns, please don't hesitate to send me a message via MyChart or call the office at [phone]. Thank you for visiting with Taylor Hines today! It's our pleasure caring for you.

## 2022-03-12 NOTE — Progress Notes (Signed)
Subjective  CC:  Chief Complaint  Patient presents with   Sore Throat    Dad stated that pt has had a sore throat for the past week.    HPI: Taylor Hines is a 36 y.o. male who presents to the office today to address the problems listed above in the chief complaint. C/o sore throat, on and off x a week. Worse today. Wants to be sure he is not contagious so he can work and help with a play at [employer] today. No fevers. Has allergies and out of flonase. + PND and congestion.  No SOB or GI sxs. No known exposure ot strep or mono. OTC analgesics have been used with minimal or mild relief. Eating and drinking OK.   HTN: on losartan: ace cough. Doing well. Feeling well. Taking medications w/o adverse effects. No symptoms of CHF, angina; no palpitations, sob, cp or lower extremity edema. Compliant with meds.  MPA w/o recent asthma flare. On asmanex and well controlled.  Depression stable on celexa  I reviewed the patients updated PMH, FH, and SocHx.    Patient Active Problem List   Diagnosis Date Noted   Mild intermittent asthma without complication 93/79/0240   High-functioning autism spectrum disorder 02/26/2019   Essential hypertension 02/26/2019   Mild persistent asthma 02/26/2019   Chronic seasonal allergic rhinitis 02/26/2019   Major depression, recurrent, chronic ([place]) 02/26/2019   Family history of malignant melanoma - father 02/26/2019   Current Meds  Medication Sig   Melatonin 10 MG TABS Take 1 tablet by mouth daily.   methylphenidate (RITALIN LA) 20 MG 24 hr capsule Take 60 mg by mouth every morning.   mometasone (ASMANEX, 60 METERED DOSES,) 220 MCG/ACT inhaler Inhale 2 puffs into the lungs daily.   [DISCONTINUED] fluticasone (FLONASE) 50 MCG/ACT nasal spray Place 2 sprays into both nostrils daily.   [DISCONTINUED] losartan (COZAAR) 50 MG tablet TAKE 1 TABLET(50 MG) BY MOUTH DAILY    Allergies: Patient is allergic to bactrim [sulfamethoxazole-trimethoprim],  multihance [gadobenate], nsaids, other, contrast media [iodinated contrast media], lisinopril, and nickel.  Review of Systems: Constitutional: Negative for fever malaise or anorexia Cardiovascular: negative for chest pain Respiratory: negative for SOB or persistent cough Gastrointestinal: negative for abdominal pain  Objective  Vitals: BP 118/70   Pulse (!) 106   Temp 97.7 F (36.5 C)   Ht 6' (1.829 m)   Wt 243 lb 6.4 oz (110.4 kg)   SpO2 98%   BMI 33.01 kg/m  General: no acute distress , A&Ox3 HEENT: PEERL, conjunctiva normal, bilateral EAC and TMs are normal. Nares normal. Oropharynx moist without erythema, no cervical LAD, 2+ tonsils, midline uvula, neck is supple Cardiovascular:  RRR without murmur or gallop.  Respiratory:  Good breath sounds bilaterally, CTAB with normal respiratory effort Skin:  Warm, no rashes    Assessment  1. Chronic seasonal allergic rhinitis   2. Essential hypertension   3. Major depression, recurrent, chronic (HCC) Chronic  4. Mild persistent asthma without complication      Plan  ST due to PND due chronic allergies. Restart flonase and add oral antihistamine. Reassured. Not contagious HTN is controlled. Refilled losartan 50 Depression: continue celexa MPA: continue asmanex  Follow up: for cpe   Commons side effects, risks, benefits, and alternatives for medications and treatment plan prescribed today were discussed, and the patient expressed understanding of the given instructions. Patient is instructed to call or message via MyChart if he/she has any questions or concerns  regarding our treatment plan. No barriers to understanding were identified. We discussed Red Flag symptoms and signs in detail. Patient expressed understanding regarding what to do in case of urgent or emergency type symptoms.  Medication list was reconciled, printed and provided to the patient in AVS. Patient instructions and summary information was reviewed with the patient  as documented in the AVS. This note was prepared with assistance of Dragon voice recognition software. Occasional wrong-word or sound-a-like substitutions may have occurred due to the inherent limitations of voice recognition software  No orders of the defined types were placed in this encounter.  Meds ordered this encounter  Medications   losartan (COZAAR) 50 MG tablet    Sig: TAKE 1 TABLET(50 MG) BY MOUTH DAILY    Dispense:  90 tablet    Refill:  3    **Patient requests 90 days supply**   fluticasone (FLONASE) 50 MCG/ACT nasal spray    Sig: Place 1 spray into both nostrils daily.    Dispense:  16 g    Refill:  11

## 2022-03-21 ENCOUNTER — Ambulatory Visit: Payer: PPO

## 2022-03-26 ENCOUNTER — Ambulatory Visit (INDEPENDENT_AMBULATORY_CARE_PROVIDER_SITE_OTHER): Payer: PPO

## 2022-03-26 VITALS — BP 126/72 | HR 100 | Temp 97.3°F | Wt 243.6 lb

## 2022-03-26 DIAGNOSIS — Z0000 Encounter for general adult medical examination without abnormal findings: Secondary | ICD-10-CM

## 2022-03-26 NOTE — Progress Notes (Addendum)
Subjective:   Taylor Hines is a 36 y.o. male who presents for an Initial Medicare Annual Wellness Visit. With Father [name]   Review of Systems     Cardiac Risk Factors include: male gender;hypertension;obesity (BMI >30kg/m2)     Objective:    Today's Vitals   03/26/22 0922  BP: 126/72  Pulse: 99  Temp: (!) 97.3 F (36.3 C)  SpO2: 99%  Weight: 243 lb 9.6 oz (110.5 kg)   Body mass index is 33.04 kg/m.     03/26/2022    9:36 AM 08/12/2018    4:15 PM  Advanced Directives  Does Patient Have a Medical Advance Directive? No No  Would patient like information on creating a medical advance directive? Yes (MAU/Ambulatory/Procedural Areas - Information given)     Current Medications (verified) Outpatient Encounter Medications as of 03/26/2022  Medication Sig   citalopram (CELEXA) 40 MG tablet    fluticasone (FLONASE) 50 MCG/ACT nasal spray Place 1 spray into both nostrils daily.   losartan (COZAAR) 50 MG tablet TAKE 1 TABLET(50 MG) BY MOUTH DAILY   Melatonin 10 MG TABS Take 1 tablet by mouth daily.   methylphenidate (RITALIN LA) 20 MG 24 hr capsule Take 60 mg by mouth every morning.   mometasone (ASMANEX, 60 METERED DOSES,) 220 MCG/ACT inhaler Inhale 2 puffs into the lungs daily.   No facility-administered encounter medications on file as of 03/26/2022.    Allergies (verified) Bactrim [sulfamethoxazole-trimethoprim], Multihance [gadobenate], Nsaids, Other, Sulfa antibiotics, Cat hair extract, Contrast media [iodinated contrast media], Lisinopril, and Nickel   History: Past Medical History:  Diagnosis Date   Allergy    Asthma    Atypical mole 12/30/2018   Left Lower Outer Back (mild)   Autism    Depression    Essential hypertension 02/26/2019   Family history of malignant melanoma - father 02/26/2019   History of chickenpox    Hypertension    Tachycardia    Past Surgical History:  Procedure Laterality Date   EXTERNAL EAR SURGERY     TIBIA FRACTURE SURGERY  Left 2020   tib/fib fx-plate.  in [place]   TONSILLECTOMY AND ADENOIDECTOMY     TYMPANOSTOMY TUBE PLACEMENT     Family History  Problem Relation Age of Onset   Asthma Mother    Diabetes Mother        Prediabetes   Hyperlipidemia Mother    Melanoma Father    Cancer Father        Skin   Depression Father    Hearing loss Father    Hypertension Father    Asthma Brother    Depression Brother    Asthma Brother    Depression Brother    Arthritis Maternal Grandmother    Diabetes Maternal Grandmother    Hearing loss Maternal Grandmother    Hyperlipidemia Maternal Grandmother    Hypertension Maternal Grandmother    Miscarriages / Stillbirths Maternal Grandmother    Alcohol abuse Maternal Grandfather    Cancer Maternal Grandfather        Prostate   Cancer Paternal Grandmother        Breast   Hearing loss Paternal Grandmother    Miscarriages / Stillbirths Paternal Grandmother    Cancer Paternal Grandfather        Skin   Bipolar disorder Paternal Grandfather    Social History   Socioeconomic History   Marital status: Single    Spouse name: Not on file   Number of children: Not on file  Years of education: Not on file   Highest education level: Not on file  Occupational History   Not on file  Tobacco Use   Smoking status: Never   Smokeless tobacco: Never  Vaping Use   Vaping Use: Never used  Substance and Sexual Activity   Alcohol use: Yes    Comment: ocassional   Drug use: Never   Sexual activity: Never  Other Topics Concern   Not on file  Social History Narrative   Not on file   Social Determinants of Health   Financial Resource Strain: Low Risk  (03/26/2022)   Overall Financial Resource Strain (CARDIA)    Difficulty of Paying Living Expenses: Not hard at all  Food Insecurity: No Food Insecurity (03/26/2022)   Hunger Vital Sign    Worried About Running Out of Food in the Last Year: Never true    [place] in the Last Year: Never true  Transportation Needs:  No Transportation Needs (03/26/2022)   PRAPARE - Hydrologist (Medical): No    Lack of Transportation (Non-Medical): No  Physical Activity: Inactive (03/26/2022)   Exercise Vital Sign    Days of Exercise per Week: 0 days    Minutes of Exercise per Session: 0 min  Stress: No Stress Concern Present (03/26/2022)   [place]    Feeling of Stress : Not at all  Social Connections: Moderately Integrated (03/26/2022)   Social Connection and Isolation Panel [NHANES]    Frequency of Communication with Friends and Family: Twice a week    Frequency of Social Gatherings with Friends and Family: Twice a week    Attends Religious Services: More than 4 times per year    Active Member of [employer] or Organizations: Yes    Attends Archivist Meetings: 1 to 4 times per year    Marital Status: Never married    Tobacco Counseling Counseling given: Not Answered   Clinical Intake:  Pre-visit preparation completed: Yes  Pain : No/denies pain     BMI - recorded: 33.04 Nutritional Status: BMI > 30  Obese Nutritional Risks: None Diabetes: No  How often do you need to have someone help you when you read instructions, pamphlets, or other written materials from your doctor or pharmacy?: 1 - Never  Diabetic?no  Interpreter Needed?: No  Information entered by :: Charlott Rakes, LPN   Activities of Daily Living    03/26/2022    9:38 AM  In your present state of health, do you have any difficulty performing the following activities:  Hearing? 0  Vision? 0  Difficulty concentrating or making decisions? 0  Walking or climbing stairs? 0  Dressing or bathing? 0  Doing errands, shopping? 0  Preparing Food and eating ? Y  Comment father assist  Using the Toilet? N  In the past six months, have you accidently leaked urine? N  Do you have problems with loss of bowel control? N  Managing your  Medications? Y  Comment father assist  Managing your Finances? Y  Comment father assist  Housekeeping or managing your Housekeeping? N    Patient Care Team: Leamon Arnt, MD as PCP - General (Family Medicine) Warren Danes, PA-C as Physician Assistant (Dermatology)  Indicate any recent Medical Services you may have received from other than [facility] providers in the past year (date may be approximate).     Assessment:   This is a routine  wellness examination for [place].  Hearing/Vision screen Hearing Screening - Comments:: Pt denies any hearing issues  Vision Screening - Comments:: Pt follows up with Dr Oswaldo Conroy for annual eye exams   Dietary issues and exercise activities discussed: Current Exercise Habits: The patient does not participate in regular exercise at present   Goals Addressed             This Visit's Progress    Patient Stated       Get weight to 200lbs        Depression Screen    03/26/2022    9:34 AM 03/12/2022   11:27 AM  PHQ 2/9 Scores  PHQ - 2 Score 0 0    Fall Risk    03/26/2022    9:37 AM 03/12/2022   11:27 AM  [place] in the past year? 0 0  Number falls in past yr: 0 0  Injury with Fall? 0 0  Risk for fall due to : Impaired vision No Fall Risks  Follow up Falls prevention discussed Falls evaluation completed    FALL RISK PREVENTION PERTAINING TO THE HOME:  Any stairs in or around the home? Yes  If so, are there any without handrails? No  Home free of loose throw rugs in walkways, pet beds, electrical cords, etc? Yes  Adequate lighting in your home to reduce risk of falls? Yes   ASSISTIVE DEVICES UTILIZED TO PREVENT FALLS:  Life alert? No  Use of a cane, walker or w/c? No  Grab bars in the bathroom? No  Shower chair or bench in shower? No  Elevated toilet seat or a handicapped toilet? No   TIMED UP AND GO:  Was the test performed? Yes .  Length of time to ambulate 10 feet: 15 sec.   Gait steady and fast  without use of assistive device  Cognitive Function:        03/26/2022    9:39 AM  6CIT Screen  What Year? 0 points  What month? 0 points  What time? 3 points  Count back from 20 0 points  Months in reverse 0 points  Repeat phrase 0 points  Total Score 3 points    Immunizations Immunization History  Administered Date(s) Administered   H1N1 04/15/2008   Hepatitis A 12/23/2008, 06/24/2014   Hepatitis A, Adult 12/23/2008, 06/24/2014   Influenza Nasal 02/23/2007   Influenza Split 03/02/2004, 03/04/2009, 01/08/2011, 01/22/2012   Influenza, Seasonal, Injecte, Preservative Fre 05/11/2003, 03/10/2008, 01/22/2012, 02/03/2014   Influenza,inj,Quad PF,6+ Mos 01/19/2015, 01/21/2017, 01/09/2018, 03/20/2018, 01/17/2019, 03/06/2022   Influenza-Unspecified 05/11/2003, 02/03/2014, 01/25/2016   PFIZER(Purple Top)SARS-COV-2 Vaccination 07/15/2019, 08/11/2019, 05/04/2020   Td 06/24/2014   Typhoid Inactivated 06/24/2014   Typhoid Live 06/24/2014    TDAP status: Up to date  Flu Vaccine status: Up to date    Covid-19 vaccine status: Completed vaccines  Qualifies for Shingles Vaccine? No    Screening Tests Health Maintenance  Topic Date Due   COVID-19 Vaccine (4 - Pfizer risk series) 03/28/2022 (Originally 06/29/2020)   Medicare Annual Wellness (AWV)  03/27/2023   TETANUS/TDAP  06/24/2024   INFLUENZA VACCINE  Completed   Hepatitis C Screening  Completed   HIV Screening  Completed   HPV VACCINES  Aged Out    Health Maintenance  There are no preventive care reminders to display for this patient.      Additional Screening:  Hepatitis C Screening: Completed 10/19/20  Vision Screening: Recommended annual ophthalmology exams for early detection of  glaucoma and other disorders of the eye. Is the patient up to date with their annual eye exam?  Yes  Who is the provider or what is the name of the office in which the patient attends annual eye exams? Dr Oswaldo Conroy  If pt is not established  with a provider, would they like to be referred to a provider to establish care? No .   Dental Screening: Recommended annual dental exams for proper oral hygiene  Community Resource Referral / Chronic Care Management: CRR required this visit?  No   CCM required this visit?  No      Plan:     I have personally reviewed and noted the following in the patient's chart:   Medical and social history Use of alcohol, tobacco or illicit drugs  Current medications and supplements including opioid prescriptions. Patient is not currently taking opioid prescriptions. Functional ability and status Nutritional status Physical activity Advanced directives List of other physicians Hospitalizations, surgeries, and ER visits in previous 12 months Vitals Screenings to include cognitive, depression, and falls Referrals and appointments  In addition, I have reviewed and discussed with patient certain preventive protocols, quality metrics, and best practice recommendations. A written personalized care plan for preventive services as well as general preventive health recommendations were provided to patient.     Willette Brace, LPN   05/24/8680   Nurse Notes: none

## 2022-03-26 NOTE — Patient Instructions (Signed)
Taylor Hines , Thank you for taking time to come for your Medicare Wellness Visit. I appreciate your ongoing commitment to your health goals. Please review the following plan we discussed and let me know if I can assist you in the future.   These are the goals we discussed:  Goals      Patient Stated     Get weight to 200lbs         This is a list of the screening recommended for you and due dates:  Health Maintenance  Topic Date Due   COVID-19 Vaccine (4 - Pfizer risk series) 03/28/2022*   Medicare Annual Wellness Visit  03/27/2023   Tetanus Vaccine  06/24/2024   Flu Shot  Completed   Hepatitis C Screening: USPSTF Recommendation to screen - Ages 18-79 yo.  Completed   HIV Screening  Completed   HPV Vaccine  Aged Out  *Topic was postponed. The date shown is not the original due date.    Advanced directives: Advance directive discussed with you today. I have provided a copy for you to complete at home and have notarized. Once this is complete please bring a copy in to our office so we can scan it into your chart.  Conditions/risks identified: to lose 200 lbs in 1 years    Next appointment: Follow up in one year for your annual wellness visit   Preventive Care 64-36 Years Old, Male Preventive care refers to lifestyle choices and visits with your health care provider that can promote health and wellness. Preventive care visits are also called wellness exams. What can I expect for my preventive care visit? Counseling During your preventive care visit, your health care provider may ask about your: Medical history, including: Past medical problems. Family medical history. Current health, including: Emotional well-being. Home life and relationship well-being. Sexual activity. Lifestyle, including: Alcohol, nicotine or tobacco, and drug use. Access to firearms. Diet, exercise, and sleep habits. Safety issues such as seatbelt and bike helmet use. Sunscreen use. Work and work  Statistician. Physical exam Your health care provider may check your: Height and weight. These may be used to calculate your BMI (body mass index). BMI is a measurement that tells if you are at a healthy weight. Waist circumference. This measures the distance around your waistline. This measurement also tells if you are at a healthy weight and may help predict your risk of certain diseases, such as type 2 diabetes and high blood pressure. Heart rate and blood pressure. Body temperature. Skin for abnormal spots. What immunizations do I need? Vaccines are usually given at various ages, according to a schedule. Your health care provider will recommend vaccines for you based on your age, medical history, and lifestyle or other factors, such as travel or where you work. What tests do I need? Screening Your health care provider may recommend screening tests for certain conditions. This may include: Lipid and cholesterol levels. Diabetes screening. This is done by checking your blood sugar (glucose) after you have not eaten for a while (fasting). Hepatitis B test. Hepatitis C test. HIV (human immunodeficiency virus) test. STI (sexually transmitted infection) testing, if you are at risk. Talk with your health care provider about your test results, treatment options, and if necessary, the need for more tests. Follow these instructions at home: Eating and drinking  Eat a healthy diet that includes fresh fruits and vegetables, whole grains, lean protein, and low-fat dairy products. Drink enough fluid to keep your urine pale yellow. Take vitamin  and mineral supplements as recommended by your health care provider. Do not drink alcohol if your health care provider tells you not to drink. If you drink alcohol: Limit how much you have to 0-2 drinks a day. Know how much alcohol is in your drink. In the [place] one drink equals one 12 oz bottle of beer (355 mL), one 5 oz glass of wine (148 mL), or one 1 oz  glass of hard liquor (44 mL). Lifestyle Brush your teeth every morning and night with fluoride toothpaste. Floss one time each day. Exercise for at least 30 minutes 5 or more days each week. Do not use any products that contain nicotine or tobacco. These products include cigarettes, chewing tobacco, and vaping devices, such as e-cigarettes. If you need help quitting, ask your health care provider. Do not use drugs. If you are sexually active, practice safe sex. Use a condom or other form of protection to prevent STIs. Find healthy ways to manage stress, such as: Meditation, yoga, or listening to music. Journaling. Talking to a trusted person. Spending time with friends and family. Minimize exposure to UV radiation to reduce your risk of skin cancer. Safety Always wear your seat belt while driving or riding in a vehicle. Do not drive: If you have been drinking alcohol. Do not ride with someone who has been drinking. If you have been using any mind-altering substances or drugs. While texting. When you are tired or distracted. Wear a helmet and other protective equipment during sports activities. If you have firearms in your house, make sure you follow all gun safety procedures. Seek help if you have been physically or sexually abused. What's next? Go to your health care provider once a year for an annual wellness visit. Ask your health care provider how often you should have your eyes and teeth checked. Stay up to date on all vaccines. This information is not intended to replace advice given to you by your health care provider. Make sure you discuss any questions you have with your health care provider. Document Revised: 11/01/2020 Document Reviewed: 11/01/2020 Elsevier Patient Education  [place].

## 2022-03-30 DIAGNOSIS — M9901 Segmental and somatic dysfunction of cervical region: Secondary | ICD-10-CM | POA: Diagnosis not present

## 2022-03-30 DIAGNOSIS — M531 Cervicobrachial syndrome: Secondary | ICD-10-CM | POA: Diagnosis not present

## 2022-03-30 DIAGNOSIS — M9903 Segmental and somatic dysfunction of lumbar region: Secondary | ICD-10-CM | POA: Diagnosis not present

## 2022-03-30 DIAGNOSIS — M5416 Radiculopathy, lumbar region: Secondary | ICD-10-CM | POA: Diagnosis not present

## 2022-03-30 DIAGNOSIS — M9902 Segmental and somatic dysfunction of thoracic region: Secondary | ICD-10-CM | POA: Diagnosis not present

## 2022-03-30 DIAGNOSIS — M5414 Radiculopathy, thoracic region: Secondary | ICD-10-CM | POA: Diagnosis not present

## 2022-04-02 DIAGNOSIS — F902 Attention-deficit hyperactivity disorder, combined type: Secondary | ICD-10-CM | POA: Diagnosis not present

## 2022-04-02 DIAGNOSIS — F419 Anxiety disorder, unspecified: Secondary | ICD-10-CM | POA: Diagnosis not present

## 2022-04-20 DIAGNOSIS — M9901 Segmental and somatic dysfunction of cervical region: Secondary | ICD-10-CM | POA: Diagnosis not present

## 2022-04-20 DIAGNOSIS — M531 Cervicobrachial syndrome: Secondary | ICD-10-CM | POA: Diagnosis not present

## 2022-04-20 DIAGNOSIS — M9902 Segmental and somatic dysfunction of thoracic region: Secondary | ICD-10-CM | POA: Diagnosis not present

## 2022-04-20 DIAGNOSIS — M5414 Radiculopathy, thoracic region: Secondary | ICD-10-CM | POA: Diagnosis not present

## 2022-04-20 DIAGNOSIS — M9903 Segmental and somatic dysfunction of lumbar region: Secondary | ICD-10-CM | POA: Diagnosis not present

## 2022-04-20 DIAGNOSIS — M5416 Radiculopathy, lumbar region: Secondary | ICD-10-CM | POA: Diagnosis not present

## 2022-04-26 ENCOUNTER — Encounter: Payer: Self-pay | Admitting: Family Medicine

## 2022-04-26 ENCOUNTER — Ambulatory Visit (INDEPENDENT_AMBULATORY_CARE_PROVIDER_SITE_OTHER): Payer: PPO | Admitting: Family Medicine

## 2022-04-26 VITALS — BP 106/70 | HR 115 | Temp 98.0°F | Ht 72.0 in | Wt 240.6 lb

## 2022-04-26 DIAGNOSIS — F840 Autistic disorder: Secondary | ICD-10-CM

## 2022-04-26 DIAGNOSIS — Z808 Family history of malignant neoplasm of other organs or systems: Secondary | ICD-10-CM | POA: Diagnosis not present

## 2022-04-26 DIAGNOSIS — I10 Essential (primary) hypertension: Secondary | ICD-10-CM | POA: Diagnosis not present

## 2022-04-26 DIAGNOSIS — F339 Major depressive disorder, recurrent, unspecified: Secondary | ICD-10-CM

## 2022-04-26 DIAGNOSIS — R000 Tachycardia, unspecified: Secondary | ICD-10-CM | POA: Diagnosis not present

## 2022-04-26 DIAGNOSIS — J4530 Mild persistent asthma, uncomplicated: Secondary | ICD-10-CM | POA: Diagnosis not present

## 2022-04-26 DIAGNOSIS — J302 Other seasonal allergic rhinitis: Secondary | ICD-10-CM

## 2022-04-26 DIAGNOSIS — Z0000 Encounter for general adult medical examination without abnormal findings: Secondary | ICD-10-CM | POA: Diagnosis not present

## 2022-04-26 NOTE — Addendum Note (Signed)
Addended by: Doran Clay A on: 04/26/2022 01:26 PM   Modules accepted: Orders

## 2022-04-26 NOTE — Patient Instructions (Signed)
Please return in 12 months for your annual complete physical; please come fasting.   I will release your lab results to you on your MyChart account with further instructions. You may see the results before I do, but when I review them I will send you a message with my report or have my assistant call you if things need to be discussed. Please reply to my message with any questions. Thank you!   If you have any questions or concerns, please don't hesitate to send me a message via MyChart or call the office at [phone]. Thank you for visiting with [place] today! It's our pleasure caring for you.   Fat and Cholesterol Restricted Eating Plan Getting too much fat and cholesterol in your diet may cause health problems. Choosing the right foods helps keep your fat and cholesterol at normal levels. This can keep you from getting certain diseases. Your doctor may recommend an eating plan that includes: Total fat: ______% or less of total calories a day. This is ______g of fat a day. Saturated fat: ______% or less of total calories a day. This is ______g of saturated fat a day. Cholesterol: less than _________mg a day. Fiber: ______g a day. What are tips for following this plan? General tips Work with your doctor to lose weight if you need to. Avoid: Foods with added sugar. Fried foods. Foods with trans fat or partially hydrogenated oils. This includes some margarines and baked goods. If you drink alcohol: Limit how much you have to: 0-1 drink a day for women who are not pregnant. 0-2 drinks a day for men. Know how much alcohol is in a drink. In the [place] one drink equals one 12 oz bottle of beer (355 mL), one 5 oz glass of wine (148 mL), or one 1 oz glass of hard liquor (44 mL). Reading food labels Check food labels for: Trans fats. Partially hydrogenated oils. Saturated fat (g) in each serving. Cholesterol (mg) in each serving. Fiber (g) in each serving. Choose foods with healthy fats, such  as: Monounsaturated fats and polyunsaturated fats. These include olive and canola oil, flaxseeds, walnuts, almonds, and seeds. Omega-3 fats. These are found in certain fish, flaxseed oil, and ground flaxseeds. Choose grain products that have whole grains. Look for the word "whole" as the first word in the ingredient list. Cooking Cook foods using low-fat methods. These include baking, boiling, grilling, and broiling. Eat more home-cooked foods. Eat at restaurants and buffets less often. Eat less fast food. Avoid cooking using saturated fats, such as butter, cream, palm oil, palm kernel oil, and coconut oil. Meal planning  At meals, divide your plate into four equal parts: Fill one-half of your plate with vegetables, green salads, and fruit. Fill one-fourth of your plate with whole grains. Fill one-fourth of your plate with low-fat (lean) protein foods. Eat fish that is high in omega-3 fats at least two times a week. This includes mackerel, tuna, sardines, and salmon. Eat foods that are high in fiber, such as whole grains, beans, apples, pears, berries, broccoli, carrots, peas, and barley. What foods should I eat? Fruits All fresh, canned (in natural juice), or frozen fruits. Vegetables Fresh or frozen vegetables (raw, steamed, roasted, or grilled). Green salads. Grains Whole grains, such as whole wheat or whole grain breads, crackers, cereals, and pasta. Unsweetened oatmeal, bulgur, barley, quinoa, or brown rice. Corn or whole wheat flour tortillas. Meats and other protein foods Ground beef (85% or leaner), grass-fed beef, or beef trimmed of  fat. Skinless chicken or [place]. Ground chicken or [place]. Pork trimmed of fat. All fish and seafood. Egg whites. Dried beans, peas, or lentils. Unsalted nuts or seeds. Unsalted canned beans. Nut butters without added sugar or oil. Dairy Low-fat or nonfat dairy products, such as skim or 1% milk, 2% or reduced-fat cheeses, low-fat and fat-free ricotta or  cottage cheese, or plain low-fat and nonfat yogurt. Fats and oils Tub margarine without trans fats. Light or reduced-fat mayonnaise and salad dressings. Avocado. Olive, canola, sesame, or safflower oils. The items listed above may not be a complete list of foods and beverages you can eat. Contact a dietitian for more information. What foods should I avoid? Fruits Canned fruit in heavy syrup. Fruit in cream or butter sauce. Fried fruit. Vegetables Vegetables cooked in cheese, cream, or butter sauce. Fried vegetables. Grains White bread. White pasta. White rice. Cornbread. Bagels, pastries, and croissants. Crackers and snack foods that contain trans fat and hydrogenated oils. Meats and other protein foods Fatty cuts of meat. Ribs, chicken wings, bacon, sausage, bologna, salami, chitterlings, fatback, hot dogs, bratwurst, and packaged lunch meats. Liver and organ meats. Whole eggs and egg yolks. Chicken and [place] with skin. Fried meat. Dairy Whole or 2% milk, cream, half-and-half, and cream cheese. Whole milk cheeses. Whole-fat or sweetened yogurt. Full-fat cheeses. Nondairy creamers and whipped toppings. Processed cheese, cheese spreads, and cheese curds. Fats and oils Butter, stick margarine, lard, shortening, ghee, or bacon fat. Coconut, palm kernel, and palm oils. Beverages Alcohol. Sugar-sweetened drinks such as sodas, lemonade, and fruit drinks. Sweets and desserts Corn syrup, sugars, honey, and molasses. Candy. Jam and jelly. Syrup. Sweetened cereals. Cookies, pies, cakes, donuts, muffins, and ice cream. The items listed above may not be a complete list of foods and beverages you should avoid. Contact a dietitian for more information. Summary Choosing the right foods helps keep your fat and cholesterol at normal levels. This can keep you from getting certain diseases. At meals, fill one-half of your plate with vegetables, green salads, and fruits. Eat high fiber foods, like whole  grains, beans, apples, pears, berries, carrots, peas, and barley. Limit added sugar, saturated fats, alcohol, and fried foods. This information is not intended to replace advice given to you by your health care provider. Make sure you discuss any questions you have with your health care provider. Document Revised: 09/15/2020 Document Reviewed: 09/15/2020 Elsevier Patient Education  [place].

## 2022-04-26 NOTE — Progress Notes (Signed)
Subjective  Chief Complaint  Patient presents with   Annual Exam    Pt here for Annual Exam and is not currently fasting     HPI: Taylor Hines is a 36 y.o. male who presents to [facility] today for a Male Wellness Visit.   Wellness Visit: annual visit with health maintenance review and exam   HM: doing great. Working. No concerns. Reviewed last note.  HTN: control remains good on arb. Working on eating better and weight loss Asthma on maintenance inhaler w/o flares or need for rescue.  Allergies improved now. Mood is stable. Managed by psych  Body mass index is 32.63 kg/m. Wt Readings from Last 3 Encounters:  04/26/22 240 lb 9.6 oz (109.1 kg)  03/26/22 243 lb 9.6 oz (110.5 kg)  03/12/22 243 lb 6.4 oz (110.4 kg)    Patient Active Problem List   Diagnosis Date Noted   Sinus tachycardia 04/26/2022   High-functioning autism spectrum disorder 02/26/2019   Essential hypertension 02/26/2019   Mild persistent asthma 02/26/2019   Chronic seasonal allergic rhinitis 02/26/2019   Major depression, recurrent, chronic ([place]) 02/26/2019   Family history of malignant melanoma - father 02/26/2019   Health Maintenance  Topic Date Due   COVID-19 Vaccine (4 - 2023-24 season) 05/12/2022 (Originally 01/18/2022)   Medicare Annual Wellness (AWV)  03/27/2023   DTaP/Tdap/Td (2 - Tdap) 06/24/2024   INFLUENZA VACCINE  Completed   Hepatitis C Screening  Completed   HIV Screening  Completed   HPV VACCINES  Aged Out   Immunization History  Administered Date(s) Administered   H1N1 04/15/2008   Hepatitis A 12/23/2008, 06/24/2014   Hepatitis A, Adult 12/23/2008, 06/24/2014   Influenza Nasal 02/23/2007   Influenza Split 03/02/2004, 03/04/2009, 01/08/2011, 01/22/2012   Influenza, Seasonal, Injecte, Preservative Fre 05/11/2003, 03/10/2008, 01/22/2012, 02/03/2014   Influenza,inj,Quad PF,6+ Mos 01/19/2015, 01/21/2017, 01/09/2018, 03/20/2018, 01/17/2019, 03/06/2022    Influenza-Unspecified 05/11/2003, 02/03/2014, 01/25/2016   PFIZER(Purple Top)SARS-COV-2 Vaccination 07/15/2019, 08/11/2019, 05/04/2020   Td 06/24/2014   Typhoid Inactivated 06/24/2014   Typhoid Live 06/24/2014   We updated and reviewed the patient's past history in detail and it is documented below. Allergies: Patient is allergic to bactrim [sulfamethoxazole-trimethoprim], multihance [gadobenate], nsaids, other, sulfa antibiotics, cat hair extract, contrast media [iodinated contrast media], lisinopril, and nickel. Past Medical History  has a past medical history of Allergy, Asthma, Atypical mole (12/30/2018), Autism, Depression, Essential hypertension (02/26/2019), Family history of malignant melanoma - father (02/26/2019), History of chickenpox, Hypertension, and Tachycardia. Past Surgical History  has a past surgical history that includes External ear surgery; Tonsillectomy and adenoidectomy; Tympanostomy tube placement; and Tibia fracture surgery (Left, 2020). Social History Patient  reports that he has never smoked. He has never used smokeless tobacco. He reports current alcohol use. He reports that he does not use drugs. Family History Patient family history includes Alcohol abuse in his maternal grandfather; Arthritis in his maternal grandmother; Asthma in his brother, brother, and mother; Bipolar disorder in his paternal grandfather; Cancer in his father, maternal grandfather, paternal grandfather, and paternal grandmother; Depression in his brother, brother, and father; Diabetes in his maternal grandmother and mother; Hearing loss in his father, maternal grandmother, and paternal grandmother; Hyperlipidemia in his maternal grandmother and mother; Hypertension in his father and maternal grandmother; Melanoma in his father; Miscarriages / Stillbirths in his maternal grandmother and paternal grandmother. Review of Systems: Constitutional: negative for fever or malaise Ophthalmic: negative for  photophobia, double vision or loss of vision  Cardiovascular: negative for chest pain, dyspnea on exertion, or new LE swelling Respiratory: negative for SOB or persistent cough Gastrointestinal: negative for abdominal pain, change in bowel habits or melena Genitourinary: negative for dysuria or gross hematuria Musculoskeletal: negative for new gait disturbance or muscular weakness Integumentary: negative for new or persistent rashes, no breast lumps Neurological: negative for TIA or stroke symptoms Psychiatric: negative for SI or delusions Allergic/Immunologic: negative for hives  Patient Care Team    Relationship Specialty Notifications Start End  Leamon Arnt, MD PCP - General Family Medicine  02/26/19   Warren Danes, PA-C Physician Assistant Dermatology  02/26/19    Objective  Vitals: BP 106/70   Pulse (!) 115   Temp 98 F (36.7 C)   Ht 6' (1.829 m)   Wt 240 lb 9.6 oz (109.1 kg)   SpO2 95%   BMI 32.63 kg/m  General:  Well developed, well nourished, no acute distress  Psych:  Alert and orientedx3,normal mood and affect HEENT:  Normocephalic, atraumatic, non-icteric sclera, PERRL, oropharynx is clear without mass or exudate, supple neck without adenopathy, mass or thyromegaly Cardiovascular:  Normal S1, S2, RRR without gallop, rub or murmur, nondisplaced PMI, +2 distal pulses in bilateral upper and lower extremities. Respiratory:  Good breath sounds bilaterally, CTAB with normal respiratory effort Gastrointestinal: normal bowel sounds, soft, non-tender, no noted masses. No HSM MSK: no deformities, contusions. Joints are without erythema or swelling. Spine and CVA region are nontender Skin:  Warm, no rashes or suspicious lesions noted Neurologic:    Mental status is normal. Gross motor and sensory exams are normal. Stable gait. No tremor GU: No inguinal hernias or adenopathy are appreciated bilaterally  Assessment  1. Annual physical exam   2. Essential hypertension   3.  Mild persistent asthma without complication   4. Major depression, recurrent, chronic ([place])   5. High-functioning autism spectrum disorder   6. Chronic seasonal allergic rhinitis   7. Sinus tachycardia   8. Family history of malignant melanoma - father      Plan  Male Wellness Visit: Age appropriate Health Maintenance and Prevention measures were discussed with patient. Included topics are cancer screening recommendations, ways to keep healthy (see AVS) including dietary and exercise recommendations, regular eye and dental care, use of seat belts, and avoidance of moderate alcohol use and tobacco use.  BMI: discussed patient's BMI and encouraged positive lifestyle modifications to help get to or maintain a target BMI. HM needs and immunizations were addressed and ordered. See below for orders. See HM and immunization section for updates. Routine labs and screening tests ordered including cmp, cbc and lipids where appropriate. Discussed recommendations regarding Vit D and calcium supplementation (see AVS)  Follow up: 80mofor cpe  Commons side effects, risks, benefits, and alternatives for medications and treatment plan prescribed today were discussed, and the patient expressed understanding of the given instructions. Patient is instructed to call or message via MyChart if he/she has any questions or concerns regarding our treatment plan. No barriers to understanding were identified. We discussed Red Flag symptoms and signs in detail. Patient expressed understanding regarding what to do in case of urgent or emergency type symptoms.  Medication list was reconciled, printed and provided to the patient in AVS. Patient instructions and summary information was reviewed with the patient as documented in the AVS. This note was prepared with assistance of Dragon voice recognition software. Occasional wrong-word or sound-a-like substitutions may have occurred due to the inherent limitations of voice  recognition software   Orders Placed This Encounter  Procedures   CBC with Differential/Platelet   Comprehensive metabolic panel   Lipid panel   No orders of the defined types were placed in this encounter.

## 2022-04-27 LAB — COMPREHENSIVE METABOLIC PANEL
AG Ratio: 2 (calc) (ref 1.0–2.5)
ALT: 19 U/L (ref 9–46)
AST: 16 U/L (ref 10–40)
Albumin: 4.4 g/dL (ref 3.6–5.1)
Alkaline phosphatase (APISO): 85 U/L (ref 36–130)
BUN/Creatinine Ratio: 18 (calc) (ref 6–22)
BUN: 25 mg/dL (ref 7–25)
CO2: 24 mmol/L (ref 20–32)
Calcium: 9.8 mg/dL (ref 8.6–10.3)
Chloride: 103 mmol/L (ref 98–110)
Creat: 1.42 mg/dL — ABNORMAL HIGH (ref 0.60–1.26)
Globulin: 2.2 g/dL (calc) (ref 1.9–3.7)
Glucose, Bld: 98 mg/dL (ref 65–99)
Potassium: 4.4 mmol/L (ref 3.5–5.3)
Sodium: 140 mmol/L (ref 135–146)
Total Bilirubin: 0.6 mg/dL (ref 0.2–1.2)
Total Protein: 6.6 g/dL (ref 6.1–8.1)

## 2022-04-27 LAB — CBC WITH DIFFERENTIAL/PLATELET
Absolute Monocytes: 704 cells/uL (ref 200–950)
Basophils Absolute: 56 cells/uL (ref 0–200)
Basophils Relative: 0.7 %
Eosinophils Absolute: 320 cells/uL (ref 15–500)
Eosinophils Relative: 4 %
HCT: 49.8 % (ref 38.5–50.0)
Hemoglobin: 17.5 g/dL — ABNORMAL HIGH (ref 13.2–17.1)
Lymphs Abs: 1760 cells/uL (ref 850–3900)
MCH: 31 pg (ref 27.0–33.0)
MCHC: 35.1 g/dL (ref 32.0–36.0)
MCV: 88.3 fL (ref 80.0–100.0)
MPV: 10.2 fL (ref 7.5–12.5)
Monocytes Relative: 8.8 %
Neutro Abs: 5160 cells/uL (ref 1500–7800)
Neutrophils Relative %: 64.5 %
Platelets: 267 10*3/uL (ref 140–400)
RBC: 5.64 10*6/uL (ref 4.20–5.80)
RDW: 12.5 % (ref 11.0–15.0)
Total Lymphocyte: 22 %
WBC: 8 10*3/uL (ref 3.8–10.8)

## 2022-04-27 LAB — LIPID PANEL
Cholesterol: 214 mg/dL — ABNORMAL HIGH (ref <200)
HDL: 40 mg/dL (ref >=40)
LDL Cholesterol (Calc): 128 mg/dL (calc) — ABNORMAL HIGH
Non-HDL Cholesterol (Calc): 174 mg/dL (calc) — ABNORMAL HIGH (ref <130)
Total CHOL/HDL Ratio: 5.4 (calc) — ABNORMAL HIGH (ref <5.0)
Triglycerides: 325 mg/dL — ABNORMAL HIGH (ref <150)

## 2022-05-02 NOTE — Progress Notes (Signed)
Please call patient: I have reviewed his/her lab results. Lab work results look ok.  I recommend a low fat diet, and ensuring he is drinking plenty of water throughout the day.  I recommend rechecking the hemoglobin and kidney test again in 6 months. Please schedule an OV and have patient drink plenty of water prior to his visit. Thanks And ask if he is taking any otc supplements please

## 2022-05-27 DIAGNOSIS — M531 Cervicobrachial syndrome: Secondary | ICD-10-CM | POA: Diagnosis not present

## 2022-05-27 DIAGNOSIS — M9903 Segmental and somatic dysfunction of lumbar region: Secondary | ICD-10-CM | POA: Diagnosis not present

## 2022-05-27 DIAGNOSIS — M5416 Radiculopathy, lumbar region: Secondary | ICD-10-CM | POA: Diagnosis not present

## 2022-05-27 DIAGNOSIS — M9902 Segmental and somatic dysfunction of thoracic region: Secondary | ICD-10-CM | POA: Diagnosis not present

## 2022-05-27 DIAGNOSIS — M9901 Segmental and somatic dysfunction of cervical region: Secondary | ICD-10-CM | POA: Diagnosis not present

## 2022-05-27 DIAGNOSIS — M5414 Radiculopathy, thoracic region: Secondary | ICD-10-CM | POA: Diagnosis not present

## 2022-06-19 DIAGNOSIS — M9903 Segmental and somatic dysfunction of lumbar region: Secondary | ICD-10-CM | POA: Diagnosis not present

## 2022-06-19 DIAGNOSIS — M9901 Segmental and somatic dysfunction of cervical region: Secondary | ICD-10-CM | POA: Diagnosis not present

## 2022-06-19 DIAGNOSIS — M531 Cervicobrachial syndrome: Secondary | ICD-10-CM | POA: Diagnosis not present

## 2022-06-19 DIAGNOSIS — M5416 Radiculopathy, lumbar region: Secondary | ICD-10-CM | POA: Diagnosis not present

## 2022-06-19 DIAGNOSIS — M5414 Radiculopathy, thoracic region: Secondary | ICD-10-CM | POA: Diagnosis not present

## 2022-06-19 DIAGNOSIS — M9902 Segmental and somatic dysfunction of thoracic region: Secondary | ICD-10-CM | POA: Diagnosis not present

## 2022-06-20 DIAGNOSIS — M5416 Radiculopathy, lumbar region: Secondary | ICD-10-CM | POA: Diagnosis not present

## 2022-06-20 DIAGNOSIS — M9902 Segmental and somatic dysfunction of thoracic region: Secondary | ICD-10-CM | POA: Diagnosis not present

## 2022-06-20 DIAGNOSIS — M5414 Radiculopathy, thoracic region: Secondary | ICD-10-CM | POA: Diagnosis not present

## 2022-06-20 DIAGNOSIS — M531 Cervicobrachial syndrome: Secondary | ICD-10-CM | POA: Diagnosis not present

## 2022-06-20 DIAGNOSIS — M9903 Segmental and somatic dysfunction of lumbar region: Secondary | ICD-10-CM | POA: Diagnosis not present

## 2022-06-20 DIAGNOSIS — M9901 Segmental and somatic dysfunction of cervical region: Secondary | ICD-10-CM | POA: Diagnosis not present

## 2022-06-25 DIAGNOSIS — F302 Manic episode, severe with psychotic symptoms: Secondary | ICD-10-CM | POA: Diagnosis not present

## 2022-06-25 DIAGNOSIS — F419 Anxiety disorder, unspecified: Secondary | ICD-10-CM | POA: Diagnosis not present

## 2022-07-10 DIAGNOSIS — M9902 Segmental and somatic dysfunction of thoracic region: Secondary | ICD-10-CM | POA: Diagnosis not present

## 2022-07-10 DIAGNOSIS — M9903 Segmental and somatic dysfunction of lumbar region: Secondary | ICD-10-CM | POA: Diagnosis not present

## 2022-07-10 DIAGNOSIS — M531 Cervicobrachial syndrome: Secondary | ICD-10-CM | POA: Diagnosis not present

## 2022-07-10 DIAGNOSIS — M5416 Radiculopathy, lumbar region: Secondary | ICD-10-CM | POA: Diagnosis not present

## 2022-07-10 DIAGNOSIS — M9901 Segmental and somatic dysfunction of cervical region: Secondary | ICD-10-CM | POA: Diagnosis not present

## 2022-07-10 DIAGNOSIS — M5414 Radiculopathy, thoracic region: Secondary | ICD-10-CM | POA: Diagnosis not present

## 2022-09-24 DIAGNOSIS — F902 Attention-deficit hyperactivity disorder, combined type: Secondary | ICD-10-CM | POA: Diagnosis not present

## 2022-09-24 DIAGNOSIS — F419 Anxiety disorder, unspecified: Secondary | ICD-10-CM | POA: Diagnosis not present

## 2022-10-08 DIAGNOSIS — M9902 Segmental and somatic dysfunction of thoracic region: Secondary | ICD-10-CM | POA: Diagnosis not present

## 2022-10-08 DIAGNOSIS — M9903 Segmental and somatic dysfunction of lumbar region: Secondary | ICD-10-CM | POA: Diagnosis not present

## 2022-10-08 DIAGNOSIS — M9901 Segmental and somatic dysfunction of cervical region: Secondary | ICD-10-CM | POA: Diagnosis not present

## 2022-10-08 DIAGNOSIS — M5414 Radiculopathy, thoracic region: Secondary | ICD-10-CM | POA: Diagnosis not present

## 2022-10-08 DIAGNOSIS — M5416 Radiculopathy, lumbar region: Secondary | ICD-10-CM | POA: Diagnosis not present

## 2022-10-08 DIAGNOSIS — M531 Cervicobrachial syndrome: Secondary | ICD-10-CM | POA: Diagnosis not present

## 2022-11-04 ENCOUNTER — Ambulatory Visit (INDEPENDENT_AMBULATORY_CARE_PROVIDER_SITE_OTHER): Payer: PPO | Admitting: Family

## 2022-11-04 VITALS — BP 101/72 | HR 85 | Temp 98.2°F | Ht 72.0 in | Wt 246.5 lb

## 2022-11-04 DIAGNOSIS — J45901 Unspecified asthma with (acute) exacerbation: Secondary | ICD-10-CM

## 2022-11-04 MED ORDER — METHYLPREDNISOLONE 4 MG PO TBPK
ORAL_TABLET | ORAL | 0 refills | Status: DC
Start: 2022-11-04 — End: 2022-11-29

## 2022-11-04 MED ORDER — ALBUTEROL SULFATE (2.5 MG/3ML) 0.083% IN NEBU
2.5000 mg | INHALATION_SOLUTION | Freq: Four times a day (QID) | RESPIRATORY_TRACT | 1 refills | Status: AC | PRN
Start: 2022-11-04 — End: ?

## 2022-11-04 NOTE — Progress Notes (Signed)
Patient ID: Taylor Hines, male    DOB: 1985/12/08, 37 y.o.   MRN: [id number]  Chief Complaint  Patient presents with   Cough    sx for 3 w    HPI:      Persistent cough:  Pt c/o Dry cough for about 3 weeks, started here and then traveled to [place]. Seen at UC last week in [place] and was Given prednisone, z-pack and tessalon pearls which did help sx, reports some mild yellow mucus at times. He tested negative for covid and strep. He reports having allergies to cats & hay & has Asthma and just uses an albuterol inhaler prn, but states he used to be on Qvar in past, pt father present and states he thinks they have inhalers at home.    Assessment & Plan:  1. Moderate asthma with exacerbation, unspecified whether persistent - lungs with mild rhonchi, sending refill of medrol pack, reminded pt & father on use & SE. Also sending refill of nebulizer med, advised to use this at least bid for the next week, also needs to restart his Qvar inhaler. Asmanex listed in med profile, but pt reports he does not have this, only Qvar at home, pt dad will make sure inhaler is still in date and let us know if refill needed. Pt advised to increase water intake to at least 2L every day.  - methylPREDNISolone (MEDROL DOSEPAK) 4 MG TBPK tablet; Take by mouth See admin instructions. follow package directions  Dispense: 21 tablet; Refill: 0 - albuterol (PROVENTIL) (2.5 MG/3ML) 0.083% nebulizer solution; Take 3 mLs (2.5 mg total) by nebulization every 6 (six) hours as needed for wheezing or shortness of breath.  Dispense: 150 mL; Refill: 1   Subjective:    Outpatient Medications Prior to Visit  Medication Sig Dispense Refill   albuterol (VENTOLIN HFA) 108 (90 Base) MCG/ACT inhaler Inhale 2 puffs into the lungs.     benzonatate (TESSALON) 100 MG capsule Take 100 mg by mouth every 8 (eight) hours.     citalopram (CELEXA) 40 MG tablet      fluticasone (FLONASE) 50 MCG/ACT nasal spray Place 1 spray into  both nostrils daily. 16 g 11   losartan (COZAAR) 50 MG tablet TAKE 1 TABLET(50 MG) BY MOUTH DAILY 90 tablet 3   Melatonin 10 MG TABS Take 1 tablet by mouth daily.     methylphenidate (RITALIN LA) 20 MG 24 hr capsule Take 60 mg by mouth every morning.     mometasone (ASMANEX, 60 METERED DOSES,) 220 MCG/ACT inhaler Inhale 2 puffs into the lungs daily. 3 each 1   azithromycin (ZITHROMAX) 250 MG tablet Take 250 mg by mouth as directed. (Patient not taking: Reported on 11/04/2022)     methylPREDNISolone (MEDROL DOSEPAK) 4 MG TBPK tablet See admin instructions. follow package directions (Patient not taking: Reported on 11/04/2022)     No facility-administered medications prior to visit.   Past Medical History:  Diagnosis Date   Allergy    Asthma    Atypical mole 12/30/2018   Left Lower Outer Back (mild)   Autism    Depression    Essential hypertension 02/26/2019   Family history of malignant melanoma - father 02/26/2019   History of chickenpox    Hypertension    Tachycardia    Past Surgical History:  Procedure Laterality Date   EXTERNAL EAR SURGERY     TIBIA FRACTURE SURGERY Left 2020   tib/fib fx-plate.  in [place]   TONSILLECTOMY AND ADENOIDECTOMY  TYMPANOSTOMY TUBE PLACEMENT     Allergies  Allergen Reactions   Bactrim [Sulfamethoxazole-Trimethoprim] Hives and Swelling   Multihance [Gadobenate] Shortness Of Breath, Swelling and Cough    50 mg of benadryl given at 345. Coughing, difficulty swallowing and breathing. Sneezing.   Nsaids Swelling   Other Swelling   Sulfa Antibiotics Hives and Swelling    Eyes "swell shut"  PN: LW Reaction:   Cat Hair Extract    Contrast Media [Iodinated Contrast Media]    Lisinopril Cough   Nickel Rash      Objective:    Physical Exam Vitals and nursing note reviewed.  Constitutional:      General: He is not in acute distress.    Appearance: Normal appearance.  HENT:     Head: Normocephalic.  Cardiovascular:     Rate and Rhythm: Normal  rate and regular rhythm.  Pulmonary:     Effort: Pulmonary effort is normal.     Breath sounds: Examination of the right-upper field reveals rhonchi. Examination of the left-upper field reveals rhonchi. Rhonchi (mild) present.  Musculoskeletal:        General: Normal range of motion.     Cervical back: Normal range of motion.  Skin:    General: Skin is warm and dry.  Neurological:     Mental Status: He is alert and oriented to person, place, and time.  Psychiatric:        Mood and Affect: Mood normal.    BP 101/72   Pulse 85   Temp 98.2 F (36.8 C) (Temporal)   Ht 6' (1.829 m)   Wt 246 lb 8 oz (111.8 kg)   SpO2 94%   BMI 33.43 kg/m  Wt Readings from Last 3 Encounters:  11/04/22 246 lb 8 oz (111.8 kg)  04/26/22 240 lb 9.6 oz (109.1 kg)  03/26/22 243 lb 9.6 oz (110.5 kg)      Dulce Sellar, NP

## 2022-11-05 ENCOUNTER — Ambulatory Visit: Payer: PPO | Admitting: Physician Assistant

## 2022-11-05 DIAGNOSIS — M9902 Segmental and somatic dysfunction of thoracic region: Secondary | ICD-10-CM | POA: Diagnosis not present

## 2022-11-05 DIAGNOSIS — M531 Cervicobrachial syndrome: Secondary | ICD-10-CM | POA: Diagnosis not present

## 2022-11-05 DIAGNOSIS — M9901 Segmental and somatic dysfunction of cervical region: Secondary | ICD-10-CM | POA: Diagnosis not present

## 2022-11-05 DIAGNOSIS — M5416 Radiculopathy, lumbar region: Secondary | ICD-10-CM | POA: Diagnosis not present

## 2022-11-05 DIAGNOSIS — M9903 Segmental and somatic dysfunction of lumbar region: Secondary | ICD-10-CM | POA: Diagnosis not present

## 2022-11-05 DIAGNOSIS — M5414 Radiculopathy, thoracic region: Secondary | ICD-10-CM | POA: Diagnosis not present

## 2022-11-29 ENCOUNTER — Encounter: Payer: Self-pay | Admitting: Family Medicine

## 2022-11-29 ENCOUNTER — Ambulatory Visit (INDEPENDENT_AMBULATORY_CARE_PROVIDER_SITE_OTHER): Payer: PPO | Admitting: Family Medicine

## 2022-11-29 VITALS — BP 124/60 | HR 91 | Temp 97.6°F | Ht 72.0 in | Wt 255.6 lb

## 2022-11-29 DIAGNOSIS — J302 Other seasonal allergic rhinitis: Secondary | ICD-10-CM

## 2022-11-29 DIAGNOSIS — I10 Essential (primary) hypertension: Secondary | ICD-10-CM

## 2022-11-29 DIAGNOSIS — F840 Autistic disorder: Secondary | ICD-10-CM

## 2022-11-29 DIAGNOSIS — J4530 Mild persistent asthma, uncomplicated: Secondary | ICD-10-CM | POA: Diagnosis not present

## 2022-11-29 DIAGNOSIS — R053 Chronic cough: Secondary | ICD-10-CM

## 2022-11-29 MED ORDER — CETIRIZINE HCL 10 MG PO TABS: 10.0000 mg | ORAL_TABLET | Freq: Every day | ORAL | Status: DC

## 2022-11-29 MED ORDER — BUDESONIDE-FORMOTEROL FUMARATE 160-4.5 MCG/ACT IN AERO: 2.0000 | INHALATION_SPRAY | Freq: Two times a day (BID) | RESPIRATORY_TRACT | 3 refills | Status: DC

## 2022-11-29 NOTE — Patient Instructions (Signed)
Please return in 2 to 3 weeks if cough not improving. Your next physical is due in December  Please start taking Flonase twice a day for 1 week, then daily. Take Zyrtec 10 mg nightly. Start Symbicort inhaler twice daily  If you have any questions or concerns, please don't hesitate to send me a message via MyChart or call the office at [phone]. Thank you for visiting with [place] today! It's our pleasure caring for you.   Cough, Adult Coughing is a reflex that clears your throat and airways (respiratory system). It helps heal and protect your lungs. It is normal to cough from time to time. A cough that happens with other symptoms or that lasts a long time may be a sign of a condition that needs treatment. A short-term (acute) cough may only last 2-3 weeks. A long-term (chronic) cough may last 8 or more weeks. Coughing is often caused by: Diseases, such as: An infection of the respiratory system. Asthma or other heart or lung diseases. Gastroesophageal reflux. This is when acid comes back up from the stomach. Breathing in things that irritate your lungs. Allergies. Postnasal drip. This is when mucus runs down the back of your throat. Smoking. Some medicines. Follow these instructions at home: Medicines Take over-the-counter and prescription medicines only as told by your health care provider. Talk with your provider before you take cough medicine (cough suppressants). Eating and drinking Do not drink alcohol. Avoid caffeine. Drink enough fluid to keep your pee (urine) pale yellow. Lifestyle Avoid cigarette smoke. Do not use any products that contain nicotine or tobacco. These products include cigarettes, chewing tobacco, and vaping devices, such as e-cigarettes. If you need help quitting, ask your provider. Avoid things that make you cough. These may include perfumes, candles, cleaning products, or campfire smoke. General instructions  Watch for any changes to your cough. Tell your  provider about them. Always cover your mouth when you cough. If the air is dry in your bedroom or home, use a cool mist vaporizer or humidifier. If your cough is worse at night, try to sleep in a semi-upright position. Rest as needed. Contact a health care provider if: You have new symptoms, or your symptoms get worse. You cough up pus. You have a fever that does not go away or a cough that does not get better after 2-3 weeks. You cannot control your cough with medicine, and you are losing sleep. You have pain that gets worse or is not helped with medicine. You lose weight for no clear reason. You have night sweats. Get help right away if: You cough up blood. You have trouble breathing. Your heart is beating very fast. These symptoms may be an emergency. Get help right away. Call 911. Do not wait to see if the symptoms will go away. Do not drive yourself to the hospital. This information is not intended to replace advice given to you by your health care provider. Make sure you discuss any questions you have with your health care provider. Document Revised: 01/04/2022 Document Reviewed: 01/04/2022 Elsevier Patient Education  2024 [employer].

## 2022-11-29 NOTE — Progress Notes (Signed)
Subjective  CC:  Chief Complaint  Patient presents with   Cough    Pt stated that he has had an ongoing cough for the past 2 mos    HPI: Taylor Hines is a 37 y.o. male who presents to the office today to address the problems listed above in the chief complaint. Patient presents with persistent cough.  I reviewed chart.  Seen last month for persistent cough and treated for asthma exacerbation.  Symptoms started prior to May.  He was treated with antibiotics and cough medicines for bronchitis and prednisone for asthma.  That helped however symptoms returned after prednisone was completed.  He was treated with another round of prednisone that was not that helpful.  He reports that he continues to have hacking cough.  He does have chronic allergies.  Medications currently include omeprazole, intermittent Zyrtec and intermittent Flonase.  He has tried Vicks vapor rub and Mucinex as well.  He denies sore throat, fevers, chills, productive cough, pleuritic chest pain, shortness of breath, tightness in the chest.  He has used albuterol nebulization with occasional relief.  Assessment  1. Persistent cough for 3 weeks or longer   2. Chronic seasonal allergic rhinitis   3. Mild persistent asthma without complication   4. Essential hypertension   5. High-functioning autism spectrum disorder      Plan  Persistent cough: Likely multifactorial.  Exam does show postnasal drainage and congestion.  No wheezing on exam and good air movement.  Avoid oral prednisone, recommend treatment for allergies and asthma: Start Zyrtec nightly, Flonase twice a day for a week and then daily, Symbicort twice daily and recheck in 2 to 3 weeks.  Can consider Singulair if needed.  Could consider allergy referral if needed.  Continue omeprazole as well. Blood pressure is controlled, he is on an ARB.  He did have an ACE related cough.  Consider changing losartan if not improving.  Follow up: 2 to 3 weeks for  recheck 04/01/2023  No orders of the defined types were placed in this encounter.  Meds ordered this encounter  Medications   budesonide-formoterol (SYMBICORT) 160-4.5 MCG/ACT inhaler    Sig: Inhale 2 puffs into the lungs 2 (two) times daily.    Dispense:  1 each    Refill:  3   cetirizine (ZYRTEC) 10 MG tablet    Sig: Take 1 tablet (10 mg total) by mouth daily.      I reviewed the patients updated PMH, FH, and SocHx.    Patient Active Problem List   Diagnosis Date Noted   Sinus tachycardia 04/26/2022   High-functioning autism spectrum disorder 02/26/2019   Essential hypertension 02/26/2019   Mild persistent asthma 02/26/2019   Chronic seasonal allergic rhinitis 02/26/2019   Major depression, recurrent, chronic (HCC) 02/26/2019   Family history of malignant melanoma - father 02/26/2019   Current Meds  Medication Sig   albuterol (PROVENTIL) (2.5 MG/3ML) 0.083% nebulizer solution Take 3 mLs (2.5 mg total) by nebulization every 6 (six) hours as needed for wheezing or shortness of breath.   albuterol (VENTOLIN HFA) 108 (90 Base) MCG/ACT inhaler Inhale 2 puffs into the lungs.   budesonide-formoterol (SYMBICORT) 160-4.5 MCG/ACT inhaler Inhale 2 puffs into the lungs 2 (two) times daily.   cetirizine (ZYRTEC) 10 MG tablet Take 1 tablet (10 mg total) by mouth daily.   citalopram (CELEXA) 40 MG tablet    fluticasone (FLONASE) 50 MCG/ACT nasal spray Place 1 spray into both nostrils daily.   losartan (  COZAAR) 50 MG tablet TAKE 1 TABLET(50 MG) BY MOUTH DAILY   Melatonin 10 MG TABS Take 1 tablet by mouth daily.   methylphenidate (RITALIN LA) 20 MG 24 hr capsule Take 60 mg by mouth every morning.   [DISCONTINUED] methylPREDNISolone (MEDROL DOSEPAK) 4 MG TBPK tablet Take by mouth See admin instructions. follow package directions   [DISCONTINUED] mometasone (ASMANEX, 60 METERED DOSES,) 220 MCG/ACT inhaler Inhale 2 puffs into the lungs daily.    Allergies: Patient is allergic to bactrim  [sulfamethoxazole-trimethoprim], multihance [gadobenate], nsaids, other, sulfa antibiotics, cat hair extract, contrast media [iodinated contrast media], lisinopril, and nickel. Family History: Patient family history includes Alcohol abuse in his maternal grandfather; Arthritis in his maternal grandmother; Asthma in his brother, brother, and mother; Bipolar disorder in his paternal grandfather; Cancer in his father, maternal grandfather, paternal grandfather, and paternal grandmother; Depression in his brother, brother, and father; Diabetes in his maternal grandmother and mother; Hearing loss in his father, maternal grandmother, and paternal grandmother; Hyperlipidemia in his maternal grandmother and mother; Hypertension in his father and maternal grandmother; Melanoma in his father; Miscarriages / Stillbirths in his maternal grandmother and paternal grandmother. Social History:  Patient  reports that he has never smoked. He has never used smokeless tobacco. He reports current alcohol use. He reports that he does not use drugs.  Review of Systems: Constitutional: Negative for fever malaise or anorexia Cardiovascular: negative for chest pain Respiratory: negative for SOB or persistent cough Gastrointestinal: negative for abdominal pain  Objective  Vitals: BP 124/60   Pulse 91   Temp 97.6 F (36.4 C)   Ht 6' (1.829 m)   Wt 255 lb 9.3 oz (115.9 kg)   SpO2 97%   BMI 34.66 kg/m  General: no acute distress but harsh hacking cough present, A&Ox3 HEENT: PEERL, conjunctiva normal, neck is supple, TMs retracted, nasal mucosa congested, postnasal drainage and posterior pharynx, no erythema or lymphadenopathy Cardiovascular:  RRR without murmur or gallop.  Respiratory:  Good breath sounds bilaterally, CTAB with normal respiratory effort Skin:  Warm, no rashes  Commons side effects, risks, benefits, and alternatives for medications and treatment plan prescribed today were discussed, and the patient  expressed understanding of the given instructions. Patient is instructed to call or message via MyChart if he/she has any questions or concerns regarding our treatment plan. No barriers to understanding were identified. We discussed Red Flag symptoms and signs in detail. Patient expressed understanding regarding what to do in case of urgent or emergency type symptoms.  Medication list was reconciled, printed and provided to the patient in AVS. Patient instructions and summary information was reviewed with the patient as documented in the AVS. This note was prepared with assistance of Dragon voice recognition software. Occasional wrong-word or sound-a-like substitutions may have occurred due to the inherent limitations of voice recognition software

## 2022-11-30 ENCOUNTER — Other Ambulatory Visit (HOSPITAL_COMMUNITY): Payer: Self-pay

## 2022-12-03 ENCOUNTER — Telehealth: Payer: Self-pay

## 2022-12-03 ENCOUNTER — Other Ambulatory Visit (HOSPITAL_COMMUNITY): Payer: Self-pay

## 2022-12-03 ENCOUNTER — Telehealth: Payer: Self-pay | Admitting: Family Medicine

## 2022-12-03 DIAGNOSIS — M9903 Segmental and somatic dysfunction of lumbar region: Secondary | ICD-10-CM | POA: Diagnosis not present

## 2022-12-03 DIAGNOSIS — M531 Cervicobrachial syndrome: Secondary | ICD-10-CM | POA: Diagnosis not present

## 2022-12-03 DIAGNOSIS — M5414 Radiculopathy, thoracic region: Secondary | ICD-10-CM | POA: Diagnosis not present

## 2022-12-03 DIAGNOSIS — M9901 Segmental and somatic dysfunction of cervical region: Secondary | ICD-10-CM | POA: Diagnosis not present

## 2022-12-03 DIAGNOSIS — M9902 Segmental and somatic dysfunction of thoracic region: Secondary | ICD-10-CM | POA: Diagnosis not present

## 2022-12-03 DIAGNOSIS — M5416 Radiculopathy, lumbar region: Secondary | ICD-10-CM | POA: Diagnosis not present

## 2022-12-03 NOTE — Telephone Encounter (Signed)
Pt states they want to know where in the process is the PA in for RX  budesonide-formoterol (SYMBICORT) 160-4.5 MCG/ACT inhaler  Please advise.

## 2022-12-03 NOTE — Telephone Encounter (Signed)
Pharmacy Patient Advocate Encounter   Received notification from Pt Calls Messages that prior authorization for Symbiort 160-4.55mcg is required/requested.   Insurance verification completed.   The patient is insured through [facility] .   Per test claim: PA started via CoverMyMeds. KEY [id number] . Waiting for clinical questions to populate.

## 2022-12-04 DIAGNOSIS — Z8042 Family history of malignant neoplasm of prostate: Secondary | ICD-10-CM | POA: Diagnosis not present

## 2022-12-04 DIAGNOSIS — R948 Abnormal results of function studies of other organs and systems: Secondary | ICD-10-CM | POA: Diagnosis not present

## 2022-12-04 DIAGNOSIS — Z125 Encounter for screening for malignant neoplasm of prostate: Secondary | ICD-10-CM | POA: Diagnosis not present

## 2022-12-06 NOTE — Telephone Encounter (Signed)
The patient's drug benefit plan provides coverage for other drugs which may be considered for treating your patient. Can your patient be treated with a formulary drug?   Available Formulary Alternatives: fluticasone-salmeterol, Wixela Inhub, BREO ELLIPTA   Please advise if to continue with the PA or if there will be a change  Thanks

## 2022-12-09 NOTE — Telephone Encounter (Signed)
Prior authorization has been submitted to [employer] via CMM

## 2022-12-10 NOTE — Telephone Encounter (Signed)
Pharmacy Patient Advocate Encounter  Received notification from [employer] that Prior Authorization for Symbicort has been DENIED because member must have tried and failed preferred drugs or give medical reason why they cannot be used. Preferred drugs: Fluticasone-salmeterol ([id number], [id number]), Wixela Inhub, Breo Ellipta - requirement: 2 alternatives..  PA #/Case ID/Reference #: [id number]   Please be advised we currently do not have a Pharmacist to review denials, therefore you will need to process appeals accordingly as needed. Thanks for your support at this time. Contact for appeals are as follows: Phone: ---, Fax: [phone]  Denial letter attached to charts

## 2022-12-17 MED ORDER — FLUTICASONE FUROATE-VILANTEROL 100-25 MCG/ACT IN AEPB
1.0000 | INHALATION_SPRAY | Freq: Every day | RESPIRATORY_TRACT | 11 refills | Status: DC
Start: 2022-12-17 — End: 2023-01-21

## 2022-12-17 NOTE — Addendum Note (Signed)
Addended by: Asencion Partridge on: 12/17/2022 01:32 PM   Modules accepted: Orders

## 2022-12-17 NOTE — Telephone Encounter (Signed)
Called and spoke with pt and below message given. 

## 2022-12-24 ENCOUNTER — Telehealth: Payer: Self-pay | Admitting: Family Medicine

## 2022-12-24 DIAGNOSIS — M9902 Segmental and somatic dysfunction of thoracic region: Secondary | ICD-10-CM | POA: Diagnosis not present

## 2022-12-24 DIAGNOSIS — M5416 Radiculopathy, lumbar region: Secondary | ICD-10-CM | POA: Diagnosis not present

## 2022-12-24 DIAGNOSIS — M9901 Segmental and somatic dysfunction of cervical region: Secondary | ICD-10-CM | POA: Diagnosis not present

## 2022-12-24 DIAGNOSIS — M5414 Radiculopathy, thoracic region: Secondary | ICD-10-CM | POA: Diagnosis not present

## 2022-12-24 DIAGNOSIS — M9903 Segmental and somatic dysfunction of lumbar region: Secondary | ICD-10-CM | POA: Diagnosis not present

## 2022-12-24 DIAGNOSIS — M531 Cervicobrachial syndrome: Secondary | ICD-10-CM | POA: Diagnosis not present

## 2022-12-24 NOTE — Telephone Encounter (Signed)
Per last conversation at last appt, pt would like a referral sent to an Allergist. He still has a cough that he can't get rid of. Please advise.

## 2022-12-25 ENCOUNTER — Other Ambulatory Visit: Payer: Self-pay

## 2022-12-25 DIAGNOSIS — T7840XA Allergy, unspecified, initial encounter: Secondary | ICD-10-CM

## 2022-12-25 DIAGNOSIS — R053 Chronic cough: Secondary | ICD-10-CM

## 2023-01-07 DIAGNOSIS — F419 Anxiety disorder, unspecified: Secondary | ICD-10-CM | POA: Diagnosis not present

## 2023-01-07 DIAGNOSIS — F902 Attention-deficit hyperactivity disorder, combined type: Secondary | ICD-10-CM | POA: Diagnosis not present

## 2023-01-14 DIAGNOSIS — M531 Cervicobrachial syndrome: Secondary | ICD-10-CM | POA: Diagnosis not present

## 2023-01-14 DIAGNOSIS — M9901 Segmental and somatic dysfunction of cervical region: Secondary | ICD-10-CM | POA: Diagnosis not present

## 2023-01-14 DIAGNOSIS — M9903 Segmental and somatic dysfunction of lumbar region: Secondary | ICD-10-CM | POA: Diagnosis not present

## 2023-01-14 DIAGNOSIS — M5414 Radiculopathy, thoracic region: Secondary | ICD-10-CM | POA: Diagnosis not present

## 2023-01-14 DIAGNOSIS — M5416 Radiculopathy, lumbar region: Secondary | ICD-10-CM | POA: Diagnosis not present

## 2023-01-14 DIAGNOSIS — M9902 Segmental and somatic dysfunction of thoracic region: Secondary | ICD-10-CM | POA: Diagnosis not present

## 2023-01-20 NOTE — Progress Notes (Unsigned)
New Patient Note  RE: Taylor Hines MRN: [id number] DOB: 10/23/1985 Date of Office Visit: 01/21/2023  Consult requested by: Willow Ora, MD Primary care provider: Willow Ora, MD  Chief Complaint: Cough (Cough that has been going on for months. Started around April/May. )  History of Present Illness: I had the pleasure of seeing Taylor Hines for initial evaluation at the [facility] on 01/22/2023. He is a 37 y.o. male, who is referred here by Willow Ora, MD for the evaluation of cough. He is accompanied today by his father who provided/contributed to the history.   He reports symptoms of coughing, nocturnal awakenings since April/May 2024. Current medications include albuterol prn which help. He reports not using aerochamber with inhalers. He tried the following inhalers: Breo, Symbicort, Qvar. Main triggers are unknown. In the last month, frequency of symptoms: daily. Frequency of nocturnal symptoms: depends. Frequency of SABA use: not recently. Interference with physical activity: no. Sleep is disturbed. In the last 12 months, emergency room visits/urgent care visits/doctor office visits or hospitalizations due to respiratory issues: no. In the last 12 months, oral steroids courses: several courses with some benefit. Lifetime history of hospitalization for respiratory issues: no. Prior intubations: no. History of pneumonia: yes. He was not evaluated by allergist/pulmonologist in the past. Smoking exposure: denies. Up to date with flu vaccine: no. Up to date with COVID-19 vaccine: yes. Prior Covid-19 infection: yes. History of reflux: yes - but does not take daily meds for this.  Tried delsym, prednisone, zyrtec with unknown benefit.  No recent CXR.  Last prednisone course was in May/June.   Assessment and Plan: Taylor Hines is a 37 y.o. male with: Mild persistent asthma without complication Chronic coughing Daily persistent coughing since  the spring 2024. Tried Breo, Symbicort and Qvar with no significant benefit. He had several courses of prednisone with some benefit. Concerned about allergic triggers. No recent CXR. Some reflux and PND symptoms as well.  Today' spirometry showed restrictive disease with 11% improvement in FEV1 post bronchodilator treatment. Clinically feeling improved and less coughing.  Today's skin testing: Positive to grass, weed, ragweed, trees, mold, dust mites, cat, dog, horse, cockroach. Borderline to feathers, tobacco.  Given daily symptoms will start with daily inhaler. Coughing is most likely multifactorial. Daily controller medication(s): Start Symbicort 2 puffs twice a day with spacer and rinse mouth afterwards. Symbicort not covered - start Advair 2 puffs twice a day. Spacer given and demonstrated proper use with inhaler. Patient understood technique and all questions/concerned were addressed.  May use albuterol rescue inhaler 2 puffs or nebulizer every 4 to 6 hours as needed for shortness of breath, chest tightness, coughing, and wheezing. May use albuterol rescue inhaler 2 puffs 5 to 15 minutes prior to strenuous physical activities. Monitor frequency of use - if you need to use it more than twice per week on a consistent basis let us know.  Get spirometry at next visit. If no improvement, get CXR next.  Other allergic rhinitis Symptomatic mainly in the summer.  Today's skin testing: Positive to grass, weed, ragweed, trees, mold, dust mites, cat, dog, horse, cockroach. Borderline to feathers, tobacco.  Start environmental control measures as below. Take Xyzal (levocetirizine) 5mg  daily at night. Start dymista (fluticasone + azelastine nasal spray combination) 1 spray per nostril twice a day. This replaces Flonase (fluticasone) for now. If it's not covered let us know.  Nasal saline spray (i.e., Simply Saline) or nasal saline lavage (  i.e., NeilMed) is recommended as needed and prior to  medicated nasal sprays. Start allergy injections - let us know when ready to start. Had a detailed discussion with patient/family that clinical history is suggestive of allergic rhinitis, and may benefit from allergy immunotherapy (AIT). Discussed in detail regarding the dosing, schedule, side effects (mild to moderate local allergic reaction and rarely systemic allergic reactions including anaphylaxis), and benefits (significant improvement in nasal symptoms, seasonal flares of asthma) of immunotherapy with the patient. There is significant time commitment involved with allergy shots, which includes weekly immunotherapy injections for first 9-12 months and then biweekly to monthly injections for 3-5 years. Consent was signed.  Other adverse food reactions, not elsewhere classified, subsequent encounter Denies any significant symptoms but not sure if any of the foods make the coughing worse. Today's skin testing showed: borderline positive to wheat, sesame, milk, egg, seafood - this may be irrelevant sensitization.  Limit cow's milk intake. For mild symptoms you can take over the counter antihistamines such as Benadryl 1-2 tablets = 25-50mg  and monitor symptoms closely.  If symptoms worsen or if you have severe symptoms including breathing issues, throat closure, significant swelling, whole body hives, severe diarrhea and vomiting, lightheadedness then seek immediate medical care. Get bloodwork.   GERD See handout for lifestyle and dietary modifications. Start omeprazole 20mg  once day - nothing to eat or drink for 20-30 minutes afterwards.   Return in about 2 months (around 03/23/2023).  Meds ordered this encounter  Medications   omeprazole (PRILOSEC OTC) 20 MG tablet    Sig: Take 1 tablet (20 mg total) by mouth daily.    Dispense:  30 tablet    Refill:  3   levocetirizine (XYZAL) 5 MG tablet    Sig: Take 1 tablet (5 mg total) by mouth every evening.    Dispense:  30 tablet    Refill:  5    Azelastine-Fluticasone 137-50 MCG/ACT SUSP    Sig: Place 1 spray into the nose in the morning and at bedtime.    Dispense:  23 g    Refill:  5   DISCONTD: budesonide-formoterol (SYMBICORT) 80-4.5 MCG/ACT inhaler    Sig: Inhale 2 puffs into the lungs in the morning and at bedtime. with spacer and rinse mouth afterwards.    Dispense:  1 each    Refill:  3   EPINEPHrine (EPIPEN 2-PAK) 0.3 mg/0.3 mL IJ SOAJ injection    Sig: Inject 0.3 mg into the muscle as needed for anaphylaxis.    Dispense:  2 each    Refill:  2    Please dispense insurance covered either generic or brand   fluticasone-salmeterol (ADVAIR HFA) 115-21 MCG/ACT inhaler    Sig: Inhale 2 puffs into the lungs 2 (two) times daily. with spacer and rinse mouth afterwards.    Dispense:  1 each    Refill:  3   Lab Orders         Allergen Profile, Shellfish         Allergen Profile, Food-Fish         Allergen Egg White         Milk IgE         Wheat IgE         Allergen Sesame f10      Other allergy screening: Rhino conjunctivitis: yes He reports symptoms of itchy/watery eyes sometimes. Symptoms have been going on for many years. The symptoms are present mainly in the summer months.  Anosmia: no. Headache:  no. He has used zyrtec, Flonase with some improvement in symptoms. Sinus infections: no. Previous work up includes: none. Previous ENT evaluation: not in [place]. History of nasal polyps: no. Last eye exam: 2024.  Food allergy: no Medication allergy: yes Hymenoptera allergy: no Urticaria: no Eczema:no History of recurrent infections suggestive of immunodeficency: no  Diagnostics: Spirometry:  Tracings reviewed. His effort: Good reproducible efforts. FVC: 4.51L FEV1: 3.53L, 75% predicted FEV1/FVC ratio: 78% Interpretation: Spirometry consistent with possible restrictive disease with 11% improvement in FEV1 post bronchodilator treatment. Clinically feeling improved and less coughing.   Please see scanned spirometry  results for details.  Skin Testing: Environmental allergy panel and select foods. Positive to grass, weed, ragweed, trees, mold, dust mites, cat, dog, horse, cockroach. Borderline to feathers, tobacco.  Borderline positive to wheat, sesame, milk, egg, seafood.  Results discussed with patient/family.  Airborne Adult Perc - 01/21/23 1137     Time Antigen Placed 1137    Allergen Manufacturer Waynette Buttery    Location Back    Number of Test 53    1. Control-Buffer 50% Glycerol Negative    2. Control-Histamine 2+    3. Bahia 3+    4. [place] 2+    5. Johnson 4+    6. Kentucky Blue 3+    7. Meadow Fescue Negative    8. Perennial Rye 4+    9. Timothy 4+    10. Ragweed Mix 2+    11. Cocklebur 2+    12. Plantain,  English 2+    13. Baccharis 2+    14. Dog Fennel 2+    15. Guernsey Thistle 2+    16. Lamb's Quarters 2+    17. Sheep Sorrell 3+    18. Rough Pigweed 2+    19. Marsh Elder, Rough 2+    20. Mugwort, Common 2+    21. Box, Elder 2+    22. Cedar, red 3+    23. Sweet Gum Negative    24. Pecan Pollen Negative    25. Pine Mix 2+    26. Walnut, Black Pollen 2+    27. Red Mulberry 2+    28. Ash Mix 2+    29. Birch Mix 3+    30. Beech American 2+    31. Cottonwood, [place] 2+    32. Hickory, White --   +/-   33. Maple Mix Negative    34. Oak, [place] Mix 2+    35. Sycamore Eastern Negative    36. Alternaria Alternata 2+    37. Cladosporium Herbarum 3+    38. Aspergillus Mix 2+    39. Penicillium Mix 3+    40. Bipolaris Sorokiniana (Helminthosporium) 2+    41. Drechslera Spicifera (Curvularia) 3+    42. Mucor Plumbeus 2+    43. Fusarium Moniliforme 3+    44. Aureobasidium Pullulans (pullulara) 3+    45. Rhizopus Oryzae --   +/-   46. Botrytis Cinera 2+    47. Epicoccum Nigrum 2+    48. Phoma Betae 3+    49. Dust Mite Mix 3+    50. Cat Hair 10,000 BAU/ml 4+    51.  Dog Epithelia 2+    52. Mixed Feathers --   +/-   53. Horse Epithelia 2+    54. Cockroach, German 2+    55.  Tobacco Leaf --   +/-            Food Adult Perc - 01/21/23 1100  Time Antigen Placed 1137    Allergen Manufacturer Greer    Location Back    Number of allergen test 9    1. Peanut Negative    2. Soybean Negative    3. Wheat --   +/-   4. Sesame --   +/-   5. Milk, Cow --   +/-   6. Casein --   +/-   7. Egg White, Chicken --   +/-   8. Shellfish Mix --   +/-   9. Fish Mix --   +/-            Past Medical History: Patient Active Problem List   Diagnosis Date Noted   Sinus tachycardia 04/26/2022   High-functioning autism spectrum disorder 02/26/2019   Essential hypertension 02/26/2019   Mild persistent asthma 02/26/2019   Chronic seasonal allergic rhinitis 02/26/2019   Major depression, recurrent, chronic (HCC) 02/26/2019   Family history of malignant melanoma - father 02/26/2019   Past Medical History:  Diagnosis Date   Allergy    Asthma    Atypical mole 12/30/2018   Left Lower Outer Back (mild)   Autism    Depression    Eczema    Essential hypertension 02/26/2019   Family history of malignant melanoma - father 02/26/2019   History of chickenpox    Hypertension    Tachycardia    Past Surgical History: Past Surgical History:  Procedure Laterality Date   EXTERNAL EAR SURGERY     TIBIA FRACTURE SURGERY Left 2020   tib/fib fx-plate.  in [place]   TONSILLECTOMY AND ADENOIDECTOMY     TYMPANOSTOMY TUBE PLACEMENT     Medication List:  Current Outpatient Medications  Medication Sig Dispense Refill   albuterol (PROVENTIL) (2.5 MG/3ML) 0.083% nebulizer solution Take 3 mLs (2.5 mg total) by nebulization every 6 (six) hours as needed for wheezing or shortness of breath. 150 mL 1   albuterol (VENTOLIN HFA) 108 (90 Base) MCG/ACT inhaler Inhale 2 puffs into the lungs.     Azelastine-Fluticasone 137-50 MCG/ACT SUSP Place 1 spray into the nose in the morning and at bedtime. 23 g 5   citalopram (CELEXA) 40 MG tablet      EPINEPHrine (EPIPEN 2-PAK) 0.3 mg/0.3 mL IJ  SOAJ injection Inject 0.3 mg into the muscle as needed for anaphylaxis. 2 each 2   fluticasone-salmeterol (ADVAIR HFA) 115-21 MCG/ACT inhaler Inhale 2 puffs into the lungs 2 (two) times daily. with spacer and rinse mouth afterwards. 1 each 3   levocetirizine (XYZAL) 5 MG tablet Take 1 tablet (5 mg total) by mouth every evening. 30 tablet 5   losartan (COZAAR) 50 MG tablet TAKE 1 TABLET(50 MG) BY MOUTH DAILY 90 tablet 3   Melatonin 10 MG TABS Take 1 tablet by mouth daily.     methylphenidate (RITALIN LA) 20 MG 24 hr capsule Take 60 mg by mouth every morning.     Multiple Vitamin (MULTIVITAMIN PO) Take by mouth.     omeprazole (PRILOSEC OTC) 20 MG tablet Take 1 tablet (20 mg total) by mouth daily. 30 tablet 3   No current facility-administered medications for this visit.   Allergies: Allergies  Allergen Reactions   Bactrim [Sulfamethoxazole-Trimethoprim] Hives and Swelling   Multihance [Gadobenate] Shortness Of Breath, Swelling and Cough    50 mg of benadryl given at 345. Coughing, difficulty swallowing and breathing. Sneezing.   Nsaids Swelling   Other Swelling   Sulfa Antibiotics Hives and Swelling    Eyes "swell  shut"  PN: LW Reaction:   Cat Hair Extract    Contrast Media [Iodinated Contrast Media]    Lisinopril Cough   Nickel Rash   Social History: Social History   Socioeconomic History   Marital status: Single    Spouse name: Not on file   Number of children: Not on file   Years of education: Not on file   Highest education level: Not on file  Occupational History   Not on file  Tobacco Use   Smoking status: Never   Smokeless tobacco: Never  Vaping Use   Vaping status: Never Used  Substance and Sexual Activity   Alcohol use: Yes    Comment: ocassional   Drug use: Never   Sexual activity: Never  Other Topics Concern   Not on file  Social History Narrative   Not on file   Social Determinants of Health   Financial Resource Strain: Low Risk  (03/26/2022)    Overall Financial Resource Strain (CARDIA)    Difficulty of Paying Living Expenses: Not hard at all  Food Insecurity: No Food Insecurity (03/26/2022)   Hunger Vital Sign    Worried About Running Out of Food in the Last Year: Never true    Ran Out of Food in the Last Year: Never true  Transportation Needs: No Transportation Needs (03/26/2022)   PRAPARE - Administrator, Civil Service (Medical): No    Lack of Transportation (Non-Medical): No  Physical Activity: Inactive (03/26/2022)   Exercise Vital Sign    Days of Exercise per Week: 0 days    Minutes of Exercise per Session: 0 min  Stress: No Stress Concern Present (03/26/2022)   [employer] of Occupational Health - Occupational Stress Questionnaire    Feeling of Stress : Not at all  Social Connections: Moderately Integrated (03/26/2022)   Social Connection and Isolation Panel [NHANES]    Frequency of Communication with Friends and Family: Twice a week    Frequency of Social Gatherings with Friends and Family: Twice a week    Attends Religious Services: More than 4 times per year    Active Member of [employer] or Organizations: Yes    Attends Banker Meetings: 1 to 4 times per year    Marital Status: Never married   Lives in a house. Smoking: denies Occupation: Associate Professor HistorySurveyor, minerals in the house: no Carpet in the family room: no Carpet in the bedroom: yes Heating: electric, gas, heat pump Cooling: central, heat pump Pet: no  Family History: Family History  Problem Relation Age of Onset   Asthma Mother    Diabetes Mother        Prediabetes   Hyperlipidemia Mother    Allergic rhinitis Father    Asthma Father    Melanoma Father    Cancer Father        Skin   Depression Father    Hearing loss Father    Hypertension Father    Food Allergy Father    Asthma Brother    Depression Brother    Asthma Brother    Depression Brother    Arthritis Maternal Grandmother     Diabetes Maternal Grandmother    Hearing loss Maternal Grandmother    Hyperlipidemia Maternal Grandmother    Hypertension Maternal Grandmother    Miscarriages / Stillbirths Maternal Grandmother    Alcohol abuse Maternal Grandfather    Cancer Maternal Grandfather        Prostate   Cancer Paternal  Grandmother        Breast   Hearing loss Paternal Grandmother    Miscarriages / Stillbirths Paternal Grandmother    Cancer Paternal Grandfather        Skin   Bipolar disorder Paternal Grandfather    Review of Systems  Constitutional:  Negative for appetite change, chills, fever and unexpected weight change.  HENT:  Positive for postnasal drip. Negative for congestion and rhinorrhea.   Eyes:  Negative for itching.  Respiratory:  Positive for cough. Negative for chest tightness, shortness of breath and wheezing.   Cardiovascular:  Negative for chest pain.  Gastrointestinal:  Negative for abdominal pain.  Genitourinary:  Negative for difficulty urinating.  Skin:  Negative for rash.  Allergic/Immunologic: Positive for environmental allergies.  Neurological:  Negative for headaches.   Objective: BP (!) 130/100   Pulse (!) 107   Temp 98 F (36.7 C)   Resp 18   Ht 6\' 1"  (1.854 m)   Wt 249 lb 8 oz (113.2 kg)   SpO2 96%   BMI 32.92 kg/m  Body mass index is 32.92 kg/m. Physical Exam Vitals and nursing note reviewed.  Constitutional:      Appearance: Normal appearance. He is well-developed.  HENT:     Head: Normocephalic and atraumatic.     Right Ear: Tympanic membrane and external ear normal.     Left Ear: Tympanic membrane and external ear normal.     Nose: Nose normal.     Mouth/Throat:     Mouth: Mucous membranes are moist.     Pharynx: Oropharynx is clear.  Eyes:     Conjunctiva/sclera: Conjunctivae normal.  Cardiovascular:     Rate and Rhythm: Normal rate and regular rhythm.     Heart sounds: Normal heart sounds. No murmur heard.    No friction rub. No gallop.   Pulmonary:     Effort: Pulmonary effort is normal.     Breath sounds: Normal breath sounds. No wheezing, rhonchi or rales.  Musculoskeletal:     Cervical back: Neck supple.  Skin:    General: Skin is warm.     Findings: No rash.  Neurological:     Mental Status: He is alert and oriented to person, place, and time.    The plan was reviewed with the patient/family, and all questions/concerned were addressed.  It was my pleasure to see Taylor Hines today and participate in his care. Please feel free to contact me with any questions or concerns.  Sincerely,  Wyline Mood, DO Allergy & Immunology  [facility] office: [phone] [facility] office: [phone]

## 2023-01-21 ENCOUNTER — Ambulatory Visit (INDEPENDENT_AMBULATORY_CARE_PROVIDER_SITE_OTHER): Payer: PPO | Admitting: Allergy

## 2023-01-21 ENCOUNTER — Encounter: Payer: Self-pay | Admitting: Allergy

## 2023-01-21 VITALS — BP 130/100 | HR 107 | Temp 98.0°F | Resp 18 | Ht 73.0 in | Wt 249.5 lb

## 2023-01-21 DIAGNOSIS — R053 Chronic cough: Secondary | ICD-10-CM | POA: Diagnosis not present

## 2023-01-21 DIAGNOSIS — K219 Gastro-esophageal reflux disease without esophagitis: Secondary | ICD-10-CM

## 2023-01-21 DIAGNOSIS — T781XXD Other adverse food reactions, not elsewhere classified, subsequent encounter: Secondary | ICD-10-CM

## 2023-01-21 DIAGNOSIS — J4530 Mild persistent asthma, uncomplicated: Secondary | ICD-10-CM

## 2023-01-21 DIAGNOSIS — J3089 Other allergic rhinitis: Secondary | ICD-10-CM | POA: Diagnosis not present

## 2023-01-21 MED ORDER — AZELASTINE-FLUTICASONE 137-50 MCG/ACT NA SUSP: 1.0000 | Freq: Two times a day (BID) | NASAL | 5 refills | Status: DC

## 2023-01-21 MED ORDER — OMEPRAZOLE MAGNESIUM 20 MG PO TBEC
20.0000 mg | DELAYED_RELEASE_TABLET | Freq: Every day | ORAL | 3 refills | Status: DC
Start: 2023-01-21 — End: 2023-05-26

## 2023-01-21 MED ORDER — EPINEPHRINE 0.3 MG/0.3ML IJ SOAJ: 0.3000 mg | INTRAMUSCULAR | 2 refills | Status: AC | PRN

## 2023-01-21 MED ORDER — LEVOCETIRIZINE DIHYDROCHLORIDE 5 MG PO TABS: 5.0000 mg | ORAL_TABLET | Freq: Every evening | ORAL | 5 refills | Status: DC

## 2023-01-21 MED ORDER — BUDESONIDE-FORMOTEROL FUMARATE 80-4.5 MCG/ACT IN AERO: 2.0000 | INHALATION_SPRAY | Freq: Two times a day (BID) | RESPIRATORY_TRACT | 3 refills | Status: DC

## 2023-01-21 MED ORDER — FLUTICASONE-SALMETEROL 115-21 MCG/ACT IN AERO: 2.0000 | INHALATION_SPRAY | Freq: Two times a day (BID) | RESPIRATORY_TRACT | 3 refills | Status: DC

## 2023-01-21 NOTE — Patient Instructions (Addendum)
Today's skin testing:  Positive to grass, weed, ragweed, trees, mold, dust mites, cat, dog, horse, cockroach. Borderline to feathers, tobacco.  Borderline positive to wheat, sesame, milk, egg, seafood.   This may be irrelevant sensitization.   Results given.  Environmental allergies Start environmental control measures as below. Take Xyzal (levocetirizine) 5mg  daily at night. Start dymista (fluticasone + azelastine nasal spray combination) 1 spray per nostril twice a day. This replaces Flonase (fluticasone) for now. If it's not covered let us know.  Nasal saline spray (i.e., Simply Saline) or nasal saline lavage (i.e., NeilMed) is recommended as needed and prior to medicated nasal sprays.  Start allergy injections - let us know when ready to start. Must make first shot appointment.  2 shots.  Had a detailed discussion with patient/family that clinical history is suggestive of allergic rhinitis, and may benefit from allergy immunotherapy (AIT). Discussed in detail regarding the dosing, schedule, side effects (mild to moderate local allergic reaction and rarely systemic allergic reactions including anaphylaxis), and benefits (significant improvement in nasal symptoms, seasonal flares of asthma) of immunotherapy with the patient. There is significant time commitment involved with allergy shots, which includes weekly immunotherapy injections for first 9-12 months and then biweekly to monthly injections for 3-5 years. Consent was signed.  Coughing/asthma Daily controller medication(s): Start Symbicort 2 puffs twice a day with spacer and rinse mouth afterwards. Symbicort not covered - start Advair 2 puffs twice a day. Spacer given and demonstrated proper use with inhaler. Patient understood technique and all questions/concerned were addressed.  May use albuterol rescue inhaler 2 puffs or nebulizer every 4 to 6 hours as needed for shortness of breath, chest tightness, coughing, and  wheezing. May use albuterol rescue inhaler 2 puffs 5 to 15 minutes prior to strenuous physical activities. Monitor frequency of use - if you need to use it more than twice per week on a consistent basis let us know.  Breathing control goals:  Full participation in all desired activities (may need albuterol before activity) Albuterol use two times or less a week on average (not counting use with activity) Cough interfering with sleep two times or less a month Oral steroids no more than once a year No hospitalizations   Food Limit cow's milk intake. For mild symptoms you can take over the counter antihistamines such as Benadryl 1-2 tablets = 25-50mg  and monitor symptoms closely.  If symptoms worsen or if you have severe symptoms including breathing issues, throat closure, significant swelling, whole body hives, severe diarrhea and vomiting, lightheadedness then seek immediate medical care.  Get bloodwork  We are ordering labs, so please allow 1-2 weeks for the results to come back. With the newly implemented Cures Act, the labs might be visible to you at the same time that they become visible to me. However, I will not address the results until all of the results are back, so please be patient.  In the meantime, continue recommendations in your patient instructions, including avoidance measures (if applicable), until you hear from me.  Reflux See handout for lifestyle and dietary modifications. Start omeprazole 20mg  once day - nothing to eat or drink for 20-30 minutes afterwards.   Return in about 2 months (around 03/23/2023). Or sooner if needed.   Reducing Pollen Exposure Pollen seasons: trees (spring), grass (summer) and ragweed/weeds (fall). Keep windows closed in your home and car to lower pollen exposure.  Install air conditioning in the bedroom and throughout the house if possible.  Avoid going out  in dry windy days - especially early morning. Pollen counts are highest between 5 - 10  AM and on dry, hot and windy days.  Save outside activities for late afternoon or after a heavy rain, when pollen levels are lower.  Avoid mowing of grass if you have grass pollen allergy. Be aware that pollen can also be transported indoors on people and pets.  Dry your clothes in an automatic dryer rather than hanging them outside where they might collect pollen.  Rinse hair and eyes before bedtime. Mold Control Mold and fungi can grow on a variety of surfaces provided certain temperature and moisture conditions exist.  Outdoor molds grow on plants, decaying vegetation and soil. The major outdoor mold, Alternaria and Cladosporium, are found in very high numbers during hot and dry conditions. Generally, a late summer - fall peak is seen for common outdoor fungal spores. Rain will temporarily lower outdoor mold spore count, but counts rise rapidly when the rainy period ends. The most important indoor molds are Aspergillus and Penicillium. Dark, humid and poorly ventilated basements are ideal sites for mold growth. The next most common sites of mold growth are the bathroom and the kitchen. Outdoor (Seasonal) Mold Control Use air conditioning and keep windows closed. Avoid exposure to decaying vegetation. Avoid leaf raking. Avoid grain handling. Consider wearing a face mask if working in moldy areas.  Indoor (Perennial) Mold Control  Maintain humidity below 50%. Get rid of mold growth on hard surfaces with water, detergent and, if necessary, 5% bleach (do not mix with other cleaners). Then dry the area completely. If mold covers an area more than 10 square feet, consider hiring an indoor environmental professional. For clothing, washing with soap and water is best. If moldy items cannot be cleaned and dried, throw them away. Remove sources e.g. contaminated carpets. Repair and seal leaking roofs or pipes. Using dehumidifiers in damp basements may be helpful, but empty the water and clean units  regularly to prevent mildew from forming. All rooms, especially basements, bathrooms and kitchens, require ventilation and cleaning to deter mold and mildew growth. Avoid carpeting on concrete or damp floors, and storing items in damp areas. Control of House Dust Mite Allergen Dust mite allergens are a common trigger of allergy and asthma symptoms. While they can be found throughout the house, these microscopic creatures thrive in warm, humid environments such as bedding, upholstered furniture and carpeting. Because so much time is spent in the bedroom, it is essential to reduce mite levels there.  Encase pillows, mattresses, and box springs in special allergen-proof fabric covers or airtight, zippered plastic covers.  Bedding should be washed weekly in hot water (130 F) and dried in a hot dryer. Allergen-proof covers are available for comforters and pillows that can't be regularly washed.  Wash the allergy-proof covers every few months. Minimize clutter in the bedroom. Keep pets out of the bedroom.  Keep humidity less than 50% by using a dehumidifier or air conditioning. You can buy a humidity measuring device called a hygrometer to monitor this.  If possible, replace carpets with hardwood, linoleum, or washable area rugs. If that's not possible, vacuum frequently with a vacuum that has a HEPA filter. Remove all upholstered furniture and non-washable window drapes from the bedroom. Remove all non-washable stuffed toys from the bedroom.  Wash stuffed toys weekly. Pet Allergen Avoidance: Contrary to popular opinion, there are no "hypoallergenic" breeds of dogs or cats. That is because people are not allergic to an animal's hair,  but to an allergen found in the animal's saliva, dander (dead skin flakes) or urine. Pet allergy symptoms typically occur within minutes. For some people, symptoms can build up and become most severe 8 to 12 hours after contact with the animal. People with severe allergies can  experience reactions in public places if dander has been transported on the pet owners' clothing. Keeping an animal outdoors is only a partial solution, since homes with pets in the yard still have higher concentrations of animal allergens. Before getting a pet, ask your allergist to determine if you are allergic to animals. If your pet is already considered part of your family, try to minimize contact and keep the pet out of the bedroom and other rooms where you spend a great deal of time. As with dust mites, vacuum carpets often or replace carpet with a hardwood floor, tile or linoleum. High-efficiency particulate air (HEPA) cleaners can reduce allergen levels over time. While dander and saliva are the source of cat and dog allergens, urine is the source of allergens from rabbits, hamsters, mice and [place] pigs; so ask a non-allergic family member to clean the animal's cage. If you have a pet allergy, talk to your allergist about the potential for allergy immunotherapy (allergy shots). This strategy can often provide long-term relief. Cockroach Allergen Avoidance Cockroaches are often found in the homes of densely populated urban areas, schools or commercial buildings, but these creatures can lurk almost anywhere. This does not mean that you have a dirty house or living area. Block all areas where roaches can enter the home. This includes crevices, wall cracks and windows.  Cockroaches need water to survive, so fix and seal all leaky faucets and pipes. Have an exterminator go through the house when your family and pets are gone to eliminate any remaining roaches. Keep food in lidded containers and put pet food dishes away after your pets are done eating. Vacuum and sweep the floor after meals, and take out garbage and recyclables. Use lidded garbage containers in the kitchen. Wash dishes immediately after use and clean under stoves, refrigerators or toasters where crumbs can accumulate. Wipe off the  stove and other kitchen surfaces and cupboards regularly.

## 2023-01-22 ENCOUNTER — Telehealth: Payer: Self-pay | Admitting: *Deleted

## 2023-01-22 DIAGNOSIS — G8929 Other chronic pain: Secondary | ICD-10-CM | POA: Diagnosis not present

## 2023-01-22 DIAGNOSIS — J45909 Unspecified asthma, uncomplicated: Secondary | ICD-10-CM | POA: Diagnosis not present

## 2023-01-22 DIAGNOSIS — F900 Attention-deficit hyperactivity disorder, predominantly inattentive type: Secondary | ICD-10-CM | POA: Diagnosis not present

## 2023-01-22 DIAGNOSIS — F840 Autistic disorder: Secondary | ICD-10-CM | POA: Diagnosis not present

## 2023-01-22 DIAGNOSIS — F3342 Major depressive disorder, recurrent, in full remission: Secondary | ICD-10-CM | POA: Diagnosis not present

## 2023-01-22 DIAGNOSIS — I10 Essential (primary) hypertension: Secondary | ICD-10-CM | POA: Diagnosis not present

## 2023-01-22 DIAGNOSIS — E669 Obesity, unspecified: Secondary | ICD-10-CM | POA: Diagnosis not present

## 2023-01-22 DIAGNOSIS — G4700 Insomnia, unspecified: Secondary | ICD-10-CM | POA: Diagnosis not present

## 2023-01-22 DIAGNOSIS — F419 Anxiety disorder, unspecified: Secondary | ICD-10-CM | POA: Diagnosis not present

## 2023-01-22 DIAGNOSIS — F411 Generalized anxiety disorder: Secondary | ICD-10-CM | POA: Diagnosis not present

## 2023-01-22 DIAGNOSIS — K219 Gastro-esophageal reflux disease without esophagitis: Secondary | ICD-10-CM | POA: Diagnosis not present

## 2023-01-22 DIAGNOSIS — J309 Allergic rhinitis, unspecified: Secondary | ICD-10-CM | POA: Diagnosis not present

## 2023-01-22 NOTE — Telephone Encounter (Signed)
Patient's father called and stated that the insurance company states that we need to call [employer] and check to see what they will pay for allergy injections with the CPT codes. I advised that we would call and will call the patients father back with an update. Patients father verbalized understanding.

## 2023-01-24 LAB — ALLERGEN PROFILE, FOOD-FISH
Allergen Mackerel IgE: <0.1 kU/L
Allergen Salmon IgE: <0.1 kU/L
Allergen Trout IgE: <0.1 kU/L
Allergen Walley Pike IgE: <0.1 kU/L
Codfish IgE: <0.1 kU/L
Halibut IgE: <0.1 kU/L
Tuna: <0.1 kU/L

## 2023-01-24 LAB — ALLERGEN PROFILE, SHELLFISH
Clam IgE: <0.1 kU/L
F023-IgE Crab: 0.53 kU/L — AB
F080-IgE Lobster: 0.35 kU/L — AB
F290-IgE Oyster: <0.1 kU/L
Scallop IgE: <0.1 kU/L
Shrimp IgE: 0.66 kU/L — AB

## 2023-01-24 LAB — ALLERGEN, WHEAT, F4: Wheat IgE: 0.21 kU/L — AB

## 2023-01-24 LAB — IGE EGG WHITE W/COMPONENT RFLX: F001-IgE Egg White: 0.16 kU/L — AB

## 2023-01-24 LAB — ALLERGEN SESAME F10: Sesame Seed IgE: <0.1 kU/L

## 2023-01-24 LAB — ALLERGEN MILK: Milk IgE: 1.56 kU/L — AB

## 2023-01-29 ENCOUNTER — Other Ambulatory Visit: Payer: Self-pay | Admitting: Allergy

## 2023-01-29 DIAGNOSIS — J3089 Other allergic rhinitis: Secondary | ICD-10-CM

## 2023-01-30 DIAGNOSIS — J301 Allergic rhinitis due to pollen: Secondary | ICD-10-CM | POA: Diagnosis not present

## 2023-01-30 NOTE — Telephone Encounter (Signed)
Pt is dual covered with medicare and [employer]. Spoke with tweedy and she stated medicare covers pretty well and he should not have but a small coapyment. Pts father will have him call us back after work

## 2023-01-30 NOTE — Progress Notes (Signed)
VIALS EXP 01-30-24

## 2023-01-30 NOTE — Progress Notes (Signed)
Aeroallergen Immunotherapy   Ordering Provider: Dr. Wyline Mood   Patient Details  Name: Lafrance Cristian  MRN: [id number]  Date of Birth: [birth year]   Order 1 of 2   Vial Label: G-Rw-W-T   0.3 ml (Volume)  BAU Concentration -- 7 Grass Mix* 100,000 ([address] [place], [place], [place], [place], Red[place], Sweet Vernal, Timothy)  0.2 ml (Volume)  1:20 Concentration -- [place]  0.3 ml (Volume)  BAU Concentration -- [place] 10,000  0.2 ml (Volume)  1:20 Concentration -- [place]  0.3 ml (Volume)  1:20 Concentration -- Ragweed Mix  0.2 ml (Volume)  1:20 Concentration -- [place] Thistle  0.2 ml (Volume)  1:40 Concentration -- Baccharis  0.2 ml (Volume)  1:80 Concentration -- Dogfennel  0.5 ml (Volume)  1:20 Concentration -- Weed Mix*  0.5 ml (Volume)  1:20 Concentration -- Eastern 10 Tree Mix (also Sweet Gum)  0.2 ml (Volume)  1:20 Concentration -- Box Elder  0.2 ml (Volume)  1:10 Concentration -- Cedar, red  0.2 ml (Volume)  1:10 Concentration -- Pine Mix  0.2 ml (Volume)  1:20 Concentration -- Red Mulberry  0.2 ml (Volume)  1:20 Concentration -- Walnut, Black Pollen    3.9  ml Extract Subtotal  1.1  ml Diluent  5.0  ml Maintenance Total   Schedule:  B  Silver Vial (1:1,000,000): Schedule B (6 doses)  Blue Vial (1:100,000): Schedule B (6 doses)  Yellow Vial (1:10,000): Schedule B (6 doses)  Green Vial (1:1,000): Schedule B (6 doses)  Red Vial (1:100): Schedule A (14 doses)   Special Instructions: 1-2 times per week during build up.

## 2023-01-30 NOTE — Progress Notes (Signed)
Aeroallergen Immunotherapy  Ordering Provider: Dr. Wyline Mood  Patient Details Name: Taylor Hines MRN: [id number] Date of Birth: 27-Nov-1985  Order 2 of 2  Vial Label: M-Dm-C-D-Cr  0.2 ml (Volume)  1:20 Concentration -- Alternaria alternata 0.2 ml (Volume)  1:20 Concentration -- Cladosporium herbarum 0.2 ml (Volume)  1:10 Concentration -- Aspergillus mix 0.2 ml (Volume)  1:10 Concentration -- Penicillium mix 0.2 ml (Volume)  1:20 Concentration -- Bipolaris sorokiniana 0.2 ml (Volume)  1:20 Concentration -- Drechslera spicifera 0.2 ml (Volume)  1:10 Concentration -- Mucor plumbeus 0.2 ml (Volume)  1:10 Concentration -- Fusarium moniliforme 0.2 ml (Volume)  1:40 Concentration -- Aureobasidium pullulans 0.2 ml (Volume)  1:40 Concentration -- Botrytis cinerea 0.2 ml (Volume)  1:40 Concentration -- Epicoccum nigrum 0.2 ml (Volume)  1:40 Concentration -- Phoma betae 0.5 ml (Volume)  1:10 Concentration -- Cat Hair 0.3 ml (Volume)  1:20 Concentration -- Cockroach, German 0.5 ml (Volume)  1:10 Concentration -- Dog Epithelia 0.5 ml (Volume)   AU Concentration -- Mite Mix (DF 5,000 & DP 5,000)   4.2  ml Extract Subtotal 0.8  ml Diluent 5.0  ml Maintenance Total  Schedule:  B Silver Vial (1:1,000,000): Schedule B (6 doses) Blue Vial (1:100,000): Schedule B (6 doses) Yellow Vial (1:10,000): Schedule B (6 doses) Green Vial (1:1,000): Schedule B (6 doses) Red Vial (1:100): Schedule A (14 doses)  Special Instructions: 1-2 times per week during build up.

## 2023-01-31 DIAGNOSIS — J3081 Allergic rhinitis due to animal (cat) (dog) hair and dander: Secondary | ICD-10-CM | POA: Diagnosis not present

## 2023-02-11 ENCOUNTER — Ambulatory Visit: Payer: PPO

## 2023-02-11 DIAGNOSIS — M9902 Segmental and somatic dysfunction of thoracic region: Secondary | ICD-10-CM | POA: Diagnosis not present

## 2023-02-11 DIAGNOSIS — M5414 Radiculopathy, thoracic region: Secondary | ICD-10-CM | POA: Diagnosis not present

## 2023-02-11 DIAGNOSIS — M9903 Segmental and somatic dysfunction of lumbar region: Secondary | ICD-10-CM | POA: Diagnosis not present

## 2023-02-11 DIAGNOSIS — M9901 Segmental and somatic dysfunction of cervical region: Secondary | ICD-10-CM | POA: Diagnosis not present

## 2023-02-11 DIAGNOSIS — M5416 Radiculopathy, lumbar region: Secondary | ICD-10-CM | POA: Diagnosis not present

## 2023-02-11 DIAGNOSIS — M531 Cervicobrachial syndrome: Secondary | ICD-10-CM | POA: Diagnosis not present

## 2023-02-13 DIAGNOSIS — L814 Other melanin hyperpigmentation: Secondary | ICD-10-CM | POA: Diagnosis not present

## 2023-02-13 DIAGNOSIS — D2239 Melanocytic nevi of other parts of face: Secondary | ICD-10-CM | POA: Diagnosis not present

## 2023-02-13 DIAGNOSIS — D225 Melanocytic nevi of trunk: Secondary | ICD-10-CM | POA: Diagnosis not present

## 2023-02-13 DIAGNOSIS — L821 Other seborrheic keratosis: Secondary | ICD-10-CM | POA: Diagnosis not present

## 2023-02-13 DIAGNOSIS — L918 Other hypertrophic disorders of the skin: Secondary | ICD-10-CM | POA: Diagnosis not present

## 2023-02-13 DIAGNOSIS — D224 Melanocytic nevi of scalp and neck: Secondary | ICD-10-CM | POA: Diagnosis not present

## 2023-02-13 DIAGNOSIS — L309 Dermatitis, unspecified: Secondary | ICD-10-CM | POA: Diagnosis not present

## 2023-02-18 ENCOUNTER — Ambulatory Visit (INDEPENDENT_AMBULATORY_CARE_PROVIDER_SITE_OTHER): Payer: PPO

## 2023-02-18 DIAGNOSIS — J309 Allergic rhinitis, unspecified: Secondary | ICD-10-CM | POA: Diagnosis not present

## 2023-02-18 NOTE — Progress Notes (Signed)
Immunotherapy   Patient Details  Name: Taylor Hines MRN: [id number] Date of Birth: Feb 09, 1986  02/18/2023  [address] started allergy injections, patient waited 30 minutes in office with no issues. Following schedule: B Frequency:1-2 times weekly Epi-Pen:Epi-Pen Available   Consent signed and patient instructions given.   Florence Canner 02/18/2023, 2:13 PM

## 2023-02-25 ENCOUNTER — Ambulatory Visit (INDEPENDENT_AMBULATORY_CARE_PROVIDER_SITE_OTHER): Payer: PPO

## 2023-02-25 DIAGNOSIS — J309 Allergic rhinitis, unspecified: Secondary | ICD-10-CM | POA: Diagnosis not present

## 2023-03-04 ENCOUNTER — Ambulatory Visit (INDEPENDENT_AMBULATORY_CARE_PROVIDER_SITE_OTHER): Payer: PPO

## 2023-03-04 DIAGNOSIS — M9903 Segmental and somatic dysfunction of lumbar region: Secondary | ICD-10-CM | POA: Diagnosis not present

## 2023-03-04 DIAGNOSIS — M9902 Segmental and somatic dysfunction of thoracic region: Secondary | ICD-10-CM | POA: Diagnosis not present

## 2023-03-04 DIAGNOSIS — M5414 Radiculopathy, thoracic region: Secondary | ICD-10-CM | POA: Diagnosis not present

## 2023-03-04 DIAGNOSIS — M5416 Radiculopathy, lumbar region: Secondary | ICD-10-CM | POA: Diagnosis not present

## 2023-03-04 DIAGNOSIS — J309 Allergic rhinitis, unspecified: Secondary | ICD-10-CM

## 2023-03-04 DIAGNOSIS — M9901 Segmental and somatic dysfunction of cervical region: Secondary | ICD-10-CM | POA: Diagnosis not present

## 2023-03-04 DIAGNOSIS — M531 Cervicobrachial syndrome: Secondary | ICD-10-CM | POA: Diagnosis not present

## 2023-03-13 ENCOUNTER — Ambulatory Visit: Payer: PPO

## 2023-03-13 DIAGNOSIS — J309 Allergic rhinitis, unspecified: Secondary | ICD-10-CM | POA: Diagnosis not present

## 2023-03-18 ENCOUNTER — Ambulatory Visit (INDEPENDENT_AMBULATORY_CARE_PROVIDER_SITE_OTHER): Payer: PPO

## 2023-03-18 DIAGNOSIS — J309 Allergic rhinitis, unspecified: Secondary | ICD-10-CM | POA: Diagnosis not present

## 2023-03-24 ENCOUNTER — Other Ambulatory Visit: Payer: Self-pay

## 2023-03-24 MED ORDER — LOSARTAN POTASSIUM 50 MG PO TABS: ORAL_TABLET | ORAL | 3 refills | Status: AC

## 2023-03-24 NOTE — Progress Notes (Unsigned)
Follow Up Note  RE: Taylor Hines MRN: [id number] DOB: September 08, 1985 Date of Office Visit: 03/25/2023  Referring provider: Willow Ora, MD Primary care provider: Willow Ora, MD  Chief Complaint: No chief complaint on file.  History of Present Illness: I had the pleasure of seeing Taylor Hines for a follow up visit at the [facility] on 03/24/2023. He is a 37 y.o. male, who is being followed for asthma, allergic rhinitis on AIT, adverse reaction and GERD. His previous allergy office visit was on 01/21/2023 with Dr. Selena Batten. Today is a regular follow up visit.  Discussed the use of AI scribe software for clinical note transcription with the patient, who gave verbal consent to proceed.  History of Present Illness          02/18/2023  G-Rw-W-T   M-Dm-C-D-Cr   Mild persistent asthma without complication Chronic coughing Daily persistent coughing since the spring 2024. Tried Breo, Symbicort and Qvar with no significant benefit. He had several courses of prednisone with some benefit. Concerned about allergic triggers. No recent CXR. Some reflux and PND symptoms as well.  Today' spirometry showed restrictive disease with 11% improvement in FEV1 post bronchodilator treatment. Clinically feeling improved and less coughing.  Today's skin testing: Positive to grass, weed, ragweed, trees, mold, dust mites, cat, dog, horse, cockroach. Borderline to feathers, tobacco.  Given daily symptoms will start with daily inhaler. Coughing is most likely multifactorial. Daily controller medication(s): Start Symbicort 2 puffs twice a day with spacer and rinse mouth afterwards. Symbicort not covered - start Advair 2 puffs twice a day. Spacer given and demonstrated proper use with inhaler. Patient understood technique and all questions/concerned were addressed.  May use albuterol rescue inhaler 2 puffs or nebulizer every 4 to 6 hours as needed for shortness of  breath, chest tightness, coughing, and wheezing. May use albuterol rescue inhaler 2 puffs 5 to 15 minutes prior to strenuous physical activities. Monitor frequency of use - if you need to use it more than twice per week on a consistent basis let us know.  Get spirometry at next visit. If no improvement, get CXR next.   Other allergic rhinitis Symptomatic mainly in the summer.  Today's skin testing: Positive to grass, weed, ragweed, trees, mold, dust mites, cat, dog, horse, cockroach. Borderline to feathers, tobacco.  Start environmental control measures as below. Take Xyzal (levocetirizine) 5mg  daily at night. Start dymista (fluticasone + azelastine nasal spray combination) 1 spray per nostril twice a day. This replaces Flonase (fluticasone) for now. If it's not covered let us know.  Nasal saline spray (i.e., Simply Saline) or nasal saline lavage (i.e., NeilMed) is recommended as needed and prior to medicated nasal sprays. Start allergy injections - let us know when ready to start. Had a detailed discussion with patient/family that clinical history is suggestive of allergic rhinitis, and may benefit from allergy immunotherapy (AIT). Discussed in detail regarding the dosing, schedule, side effects (mild to moderate local allergic reaction and rarely systemic allergic reactions including anaphylaxis), and benefits (significant improvement in nasal symptoms, seasonal flares of asthma) of immunotherapy with the patient. There is significant time commitment involved with allergy shots, which includes weekly immunotherapy injections for first 9-12 months and then biweekly to monthly injections for 3-5 years. Consent was signed.   Other adverse food reactions, not elsewhere classified, subsequent encounter Denies any significant symptoms but not sure if any of the foods make the coughing worse. Today's skin testing showed:  borderline positive to wheat, sesame, milk, egg, seafood - this may be irrelevant  sensitization.  Limit cow's milk intake. For mild symptoms you can take over the counter antihistamines such as Benadryl 1-2 tablets = 25-50mg  and monitor symptoms closely.  If symptoms worsen or if you have severe symptoms including breathing issues, throat closure, significant swelling, whole body hives, severe diarrhea and vomiting, lightheadedness then seek immediate medical care. Get bloodwork.    GERD See handout for lifestyle and dietary modifications. Start omeprazole 20mg  once day - nothing to eat or drink for 20-30 minutes afterwards.    Return in about 2 months (around 03/23/2023).  Assessment and Plan: Taylor Hines is a 37 y.o. male with: *** Assessment and Plan              No follow-ups on file.  No orders of the defined types were placed in this encounter.  Lab Orders  No laboratory test(s) ordered today    Diagnostics: Spirometry:  Tracings reviewed. His effort: {Blank single:19197::"Good reproducible efforts.","It was hard to get consistent efforts and there is a question as to whether this reflects a maximal maneuver.","Poor effort, data can not be interpreted."} FVC: ***L FEV1: ***L, ***% predicted FEV1/FVC ratio: ***% Interpretation: {Blank single:19197::"Spirometry consistent with mild obstructive disease","Spirometry consistent with moderate obstructive disease","Spirometry consistent with severe obstructive disease","Spirometry consistent with possible restrictive disease","Spirometry consistent with mixed obstructive and restrictive disease","Spirometry uninterpretable due to technique","Spirometry consistent with normal pattern","No overt abnormalities noted given today's efforts"}.  Please see scanned spirometry results for details.  Skin Testing: {Blank single:19197::"Select foods","Environmental allergy panel","Environmental allergy panel and select foods","Food allergy panel","None","Deferred due to recent antihistamines use"}. *** Results discussed  with patient/family.   Medication List:  Current Outpatient Medications  Medication Sig Dispense Refill   albuterol (PROVENTIL) (2.5 MG/3ML) 0.083% nebulizer solution Take 3 mLs (2.5 mg total) by nebulization every 6 (six) hours as needed for wheezing or shortness of breath. 150 mL 1   albuterol (VENTOLIN HFA) 108 (90 Base) MCG/ACT inhaler Inhale 2 puffs into the lungs.     Azelastine-Fluticasone 137-50 MCG/ACT SUSP Place 1 spray into the nose in the morning and at bedtime. 23 g 5   citalopram (CELEXA) 40 MG tablet      EPINEPHrine (EPIPEN 2-PAK) 0.3 mg/0.3 mL IJ SOAJ injection Inject 0.3 mg into the muscle as needed for anaphylaxis. 2 each 2   fluticasone-salmeterol (ADVAIR HFA) 115-21 MCG/ACT inhaler Inhale 2 puffs into the lungs 2 (two) times daily. with spacer and rinse mouth afterwards. 1 each 3   levocetirizine (XYZAL) 5 MG tablet Take 1 tablet (5 mg total) by mouth every evening. 30 tablet 5   losartan (COZAAR) 50 MG tablet TAKE 1 TABLET(50 MG) BY MOUTH DAILY 90 tablet 3   Melatonin 10 MG TABS Take 1 tablet by mouth daily.     methylphenidate (RITALIN LA) 20 MG 24 hr capsule Take 60 mg by mouth every morning.     Multiple Vitamin (MULTIVITAMIN PO) Take by mouth.     omeprazole (PRILOSEC OTC) 20 MG tablet Take 1 tablet (20 mg total) by mouth daily. 30 tablet 3   No current facility-administered medications for this visit.   Allergies: Allergies  Allergen Reactions   Bactrim [Sulfamethoxazole-Trimethoprim] Hives and Swelling   Multihance [Gadobenate] Shortness Of Breath, Swelling and Cough    50 mg of benadryl given at 345. Coughing, difficulty swallowing and breathing. Sneezing.   Nsaids Swelling   Other Swelling   Sulfa Antibiotics Hives and Swelling  Eyes "swell shut"  PN: LW Reaction:   Cat Hair Extract    Contrast Media [Iodinated Contrast Media]    Lisinopril Cough   Nickel Rash   I reviewed his past medical history, social history, family history, and environmental  history and no significant changes have been reported from his previous visit.  Review of Systems  Constitutional:  Negative for appetite change, chills, fever and unexpected weight change.  HENT:  Positive for postnasal drip. Negative for congestion and rhinorrhea.   Eyes:  Negative for itching.  Respiratory:  Positive for cough. Negative for chest tightness, shortness of breath and wheezing.   Cardiovascular:  Negative for chest pain.  Gastrointestinal:  Negative for abdominal pain.  Genitourinary:  Negative for difficulty urinating.  Skin:  Negative for rash.  Allergic/Immunologic: Positive for environmental allergies.  Neurological:  Negative for headaches.    Objective: There were no vitals taken for this visit. There is no height or weight on file to calculate BMI. Physical Exam Vitals and nursing note reviewed.  Constitutional:      Appearance: Normal appearance. He is well-developed.  HENT:     Head: Normocephalic and atraumatic.     Right Ear: Tympanic membrane and external ear normal.     Left Ear: Tympanic membrane and external ear normal.     Nose: Nose normal.     Mouth/Throat:     Mouth: Mucous membranes are moist.     Pharynx: Oropharynx is clear.  Eyes:     Conjunctiva/sclera: Conjunctivae normal.  Cardiovascular:     Rate and Rhythm: Normal rate and regular rhythm.     Heart sounds: Normal heart sounds. No murmur heard.    No friction rub. No gallop.  Pulmonary:     Effort: Pulmonary effort is normal.     Breath sounds: Normal breath sounds. No wheezing, rhonchi or rales.  Musculoskeletal:     Cervical back: Neck supple.  Skin:    General: Skin is warm.     Findings: No rash.  Neurological:     Mental Status: He is alert and oriented to person, place, and time.    Previous notes and tests were reviewed. The plan was reviewed with the patient/family, and all questions/concerned were addressed.  It was my pleasure to see Moiz today and  participate in his care. Please feel free to contact me with any questions or concerns.  Sincerely,  Wyline Mood, DO Allergy & Immunology  [facility] office: [phone] [facility] office: [phone]

## 2023-03-25 ENCOUNTER — Ambulatory Visit: Payer: PPO | Admitting: Allergy

## 2023-03-25 ENCOUNTER — Encounter: Payer: Self-pay | Admitting: Allergy

## 2023-03-25 VITALS — BP 118/78 | HR 101 | Temp 98.1°F | Resp 16

## 2023-03-25 DIAGNOSIS — J301 Allergic rhinitis due to pollen: Secondary | ICD-10-CM | POA: Diagnosis not present

## 2023-03-25 DIAGNOSIS — K219 Gastro-esophageal reflux disease without esophagitis: Secondary | ICD-10-CM | POA: Diagnosis not present

## 2023-03-25 DIAGNOSIS — J3081 Allergic rhinitis due to animal (cat) (dog) hair and dander: Secondary | ICD-10-CM | POA: Diagnosis not present

## 2023-03-25 DIAGNOSIS — J4520 Mild intermittent asthma, uncomplicated: Secondary | ICD-10-CM

## 2023-03-25 DIAGNOSIS — T781XXD Other adverse food reactions, not elsewhere classified, subsequent encounter: Secondary | ICD-10-CM

## 2023-03-25 DIAGNOSIS — Z91038 Other insect allergy status: Secondary | ICD-10-CM

## 2023-03-25 DIAGNOSIS — J3089 Other allergic rhinitis: Secondary | ICD-10-CM

## 2023-03-25 DIAGNOSIS — J309 Allergic rhinitis, unspecified: Secondary | ICD-10-CM

## 2023-03-25 NOTE — Patient Instructions (Addendum)
Environmental allergies 2024 skin testing positive to grass, weed, ragweed, trees, mold, dust mites, cat, dog, horse, cockroach. Borderline to feathers, tobacco.  Continue environmental control measures as below. Take Xyzal (levocetirizine) 5mg  daily at night. Nasal saline spray (i.e., Simply Saline) or nasal saline lavage (i.e., NeilMed) is recommended as needed and prior to medicated nasal sprays. Continue allergy injections - given today.  You can take an extra Xyzal the morning of injections.  I prefer this over Benadryl.   Breathing  Normal breathing test today. May use albuterol rescue inhaler 2 puffs every 4 to 6 hours as needed for shortness of breath, chest tightness, coughing, and wheezing. May use albuterol rescue inhaler 2 puffs 5 to 15 minutes prior to strenuous physical activities. Monitor frequency of use - if you need to use it more than twice per week on a consistent basis let us know.   Food Monitor symptoms after eating milk and seafood.  For mild symptoms you can take over the counter antihistamines such as Benadryl 1-2 tablets = 25-50mg  and monitor symptoms closely.  If symptoms worsen or if you have severe symptoms including breathing issues, throat closure, significant swelling, whole body hives, severe diarrhea and vomiting, lightheadedness then seek immediate medical care.  Reflux Continue lifestyle and dietary modifications. Take omeprazole 20mg  once day only as needed - nothing to eat or drink for 20-30 minutes afterwards.   Return in about 6 months (around 09/22/2023). Or sooner if needed.   Reducing Pollen Exposure Pollen seasons: trees (spring), grass (summer) and ragweed/weeds (fall). Keep windows closed in your home and car to lower pollen exposure.  Install air conditioning in the bedroom and throughout the house if possible.  Avoid going out in dry windy days - especially early morning. Pollen counts are highest between 5 - 10 AM and on dry, hot and windy  days.  Save outside activities for late afternoon or after a heavy rain, when pollen levels are lower.  Avoid mowing of grass if you have grass pollen allergy. Be aware that pollen can also be transported indoors on people and pets.  Dry your clothes in an automatic dryer rather than hanging them outside where they might collect pollen.  Rinse hair and eyes before bedtime. Mold Control Mold and fungi can grow on a variety of surfaces provided certain temperature and moisture conditions exist.  Outdoor molds grow on plants, decaying vegetation and soil. The major outdoor mold, Alternaria and Cladosporium, are found in very high numbers during hot and dry conditions. Generally, a late summer - fall peak is seen for common outdoor fungal spores. Rain will temporarily lower outdoor mold spore count, but counts rise rapidly when the rainy period ends. The most important indoor molds are Aspergillus and Penicillium. Dark, humid and poorly ventilated basements are ideal sites for mold growth. The next most common sites of mold growth are the bathroom and the kitchen. Outdoor (Seasonal) Mold Control Use air conditioning and keep windows closed. Avoid exposure to decaying vegetation. Avoid leaf raking. Avoid grain handling. Consider wearing a face mask if working in moldy areas.  Indoor (Perennial) Mold Control  Maintain humidity below 50%. Get rid of mold growth on hard surfaces with water, detergent and, if necessary, 5% bleach (do not mix with other cleaners). Then dry the area completely. If mold covers an area more than 10 square feet, consider hiring an indoor environmental professional. For clothing, washing with soap and water is best. If moldy items cannot be cleaned and dried,  throw them away. Remove sources e.g. contaminated carpets. Repair and seal leaking roofs or pipes. Using dehumidifiers in damp basements may be helpful, but empty the water and clean units regularly to prevent mildew from  forming. All rooms, especially basements, bathrooms and kitchens, require ventilation and cleaning to deter mold and mildew growth. Avoid carpeting on concrete or damp floors, and storing items in damp areas. Control of House Dust Mite Allergen Dust mite allergens are a common trigger of allergy and asthma symptoms. While they can be found throughout the house, these microscopic creatures thrive in warm, humid environments such as bedding, upholstered furniture and carpeting. Because so much time is spent in the bedroom, it is essential to reduce mite levels there.  Encase pillows, mattresses, and box springs in special allergen-proof fabric covers or airtight, zippered plastic covers.  Bedding should be washed weekly in hot water (130 F) and dried in a hot dryer. Allergen-proof covers are available for comforters and pillows that can't be regularly washed.  Wash the allergy-proof covers every few months. Minimize clutter in the bedroom. Keep pets out of the bedroom.  Keep humidity less than 50% by using a dehumidifier or air conditioning. You can buy a humidity measuring device called a hygrometer to monitor this.  If possible, replace carpets with hardwood, linoleum, or washable area rugs. If that's not possible, vacuum frequently with a vacuum that has a HEPA filter. Remove all upholstered furniture and non-washable window drapes from the bedroom. Remove all non-washable stuffed toys from the bedroom.  Wash stuffed toys weekly. Pet Allergen Avoidance: Contrary to popular opinion, there are no "hypoallergenic" breeds of dogs or cats. That is because people are not allergic to an animal's hair, but to an allergen found in the animal's saliva, dander (dead skin flakes) or urine. Pet allergy symptoms typically occur within minutes. For some people, symptoms can build up and become most severe 8 to 12 hours after contact with the animal. People with severe allergies can experience reactions in public  places if dander has been transported on the pet owners' clothing. Keeping an animal outdoors is only a partial solution, since homes with pets in the yard still have higher concentrations of animal allergens. Before getting a pet, ask your allergist to determine if you are allergic to animals. If your pet is already considered part of your family, try to minimize contact and keep the pet out of the bedroom and other rooms where you spend a great deal of time. As with dust mites, vacuum carpets often or replace carpet with a hardwood floor, tile or linoleum. High-efficiency particulate air (HEPA) cleaners can reduce allergen levels over time. While dander and saliva are the source of cat and dog allergens, urine is the source of allergens from rabbits, hamsters, mice and [place] pigs; so ask a non-allergic family member to clean the animal's cage. If you have a pet allergy, talk to your allergist about the potential for allergy immunotherapy (allergy shots). This strategy can often provide long-term relief. Cockroach Allergen Avoidance Cockroaches are often found in the homes of densely populated urban areas, schools or commercial buildings, but these creatures can lurk almost anywhere. This does not mean that you have a dirty house or living area. Block all areas where roaches can enter the home. This includes crevices, wall cracks and windows.  Cockroaches need water to survive, so fix and seal all leaky faucets and pipes. Have an exterminator go through the house when your family and pets are  gone to eliminate any remaining roaches. Keep food in lidded containers and put pet food dishes away after your pets are done eating. Vacuum and sweep the floor after meals, and take out garbage and recyclables. Use lidded garbage containers in the kitchen. Wash dishes immediately after use and clean under stoves, refrigerators or toasters where crumbs can accumulate. Wipe off the stove and other kitchen surfaces  and cupboards regularly.

## 2023-04-01 ENCOUNTER — Ambulatory Visit (INDEPENDENT_AMBULATORY_CARE_PROVIDER_SITE_OTHER): Payer: PPO

## 2023-04-01 DIAGNOSIS — F302 Manic episode, severe with psychotic symptoms: Secondary | ICD-10-CM | POA: Diagnosis not present

## 2023-04-01 DIAGNOSIS — M5414 Radiculopathy, thoracic region: Secondary | ICD-10-CM | POA: Diagnosis not present

## 2023-04-01 DIAGNOSIS — M531 Cervicobrachial syndrome: Secondary | ICD-10-CM | POA: Diagnosis not present

## 2023-04-01 DIAGNOSIS — M9903 Segmental and somatic dysfunction of lumbar region: Secondary | ICD-10-CM | POA: Diagnosis not present

## 2023-04-01 DIAGNOSIS — J309 Allergic rhinitis, unspecified: Secondary | ICD-10-CM | POA: Diagnosis not present

## 2023-04-01 DIAGNOSIS — M5416 Radiculopathy, lumbar region: Secondary | ICD-10-CM | POA: Diagnosis not present

## 2023-04-01 DIAGNOSIS — M9901 Segmental and somatic dysfunction of cervical region: Secondary | ICD-10-CM | POA: Diagnosis not present

## 2023-04-01 DIAGNOSIS — M9902 Segmental and somatic dysfunction of thoracic region: Secondary | ICD-10-CM | POA: Diagnosis not present

## 2023-04-07 ENCOUNTER — Ambulatory Visit (INDEPENDENT_AMBULATORY_CARE_PROVIDER_SITE_OTHER): Payer: Medicaid Other

## 2023-04-07 VITALS — BP 110/70 | HR 98 | Temp 98.1°F | Wt 251.6 lb

## 2023-04-07 DIAGNOSIS — Z0000 Encounter for general adult medical examination without abnormal findings: Secondary | ICD-10-CM

## 2023-04-07 NOTE — Progress Notes (Signed)
Subjective:   Taylor Hines is a 37 y.o. male who presents for Medicare Annual/Subsequent preventive examination.  Visit Complete: In person  Cardiac Risk Factors include: hypertension;male gender;obesity (BMI >30kg/m2)     Objective:    Today's Vitals   04/07/23 1418 04/07/23 1427  BP: 110/70   Pulse: (!) 105 98  Temp: 98.1 F (36.7 C)   SpO2: 92%   Weight: 251 lb 9.6 oz (114.1 kg)    Body mass index is 33.19 kg/m.     04/07/2023    2:23 PM 03/26/2022    9:36 AM 08/12/2018    4:15 PM  Advanced Directives  Does Patient Have a Medical Advance Directive? No No No  Would patient like information on creating a medical advance directive? No - Patient declined Yes (MAU/Ambulatory/Procedural Areas - Information given)     Current Medications (verified) Outpatient Encounter Medications as of 04/07/2023  Medication Sig   albuterol (PROVENTIL) (2.5 MG/3ML) 0.083% nebulizer solution Take 3 mLs (2.5 mg total) by nebulization every 6 (six) hours as needed for wheezing or shortness of breath.   albuterol (VENTOLIN HFA) 108 (90 Base) MCG/ACT inhaler Inhale 2 puffs into the lungs.   citalopram (CELEXA) 40 MG tablet    levocetirizine (XYZAL) 5 MG tablet Take 1 tablet (5 mg total) by mouth every evening.   losartan (COZAAR) 50 MG tablet TAKE 1 TABLET(50 MG) BY MOUTH DAILY   methylphenidate (RITALIN LA) 20 MG 24 hr capsule Take 60 mg by mouth every morning.   Multiple Vitamin (MULTIVITAMIN PO) Take by mouth.   omeprazole (PRILOSEC OTC) 20 MG tablet Take 1 tablet (20 mg total) by mouth daily.   EPINEPHrine (EPIPEN 2-PAK) 0.3 mg/0.3 mL IJ SOAJ injection Inject 0.3 mg into the muscle as needed for anaphylaxis. (Patient not taking: Reported on 04/07/2023)   No facility-administered encounter medications on file as of 04/07/2023.    Allergies (verified) Bactrim [sulfamethoxazole-trimethoprim], Multihance [gadobenate], Nsaids, Other, Sulfa antibiotics, Cat hair extract,  Contrast media [iodinated contrast media], Lisinopril, and Nickel   History: Past Medical History:  Diagnosis Date   Allergy    Asthma    Atypical mole 12/30/2018   Left Lower Outer Back (mild)   Autism    Depression    Eczema    Essential hypertension 02/26/2019   Family history of malignant melanoma - father 02/26/2019   History of chickenpox    Hypertension    Tachycardia    Past Surgical History:  Procedure Laterality Date   EXTERNAL EAR SURGERY     TIBIA FRACTURE SURGERY Left 2020   tib/fib fx-plate.  in [place]   TONSILLECTOMY AND ADENOIDECTOMY     TYMPANOSTOMY TUBE PLACEMENT     Family History  Problem Relation Age of Onset   Asthma Mother    Diabetes Mother        Prediabetes   Hyperlipidemia Mother    Allergic rhinitis Father    Asthma Father    Melanoma Father    Cancer Father        Skin   Depression Father    Hearing loss Father    Hypertension Father    Food Allergy Father    Asthma Brother    Depression Brother    Asthma Brother    Depression Brother    Arthritis Maternal Grandmother    Diabetes Maternal Grandmother    Hearing loss Maternal Grandmother    Hyperlipidemia Maternal Grandmother    Hypertension Maternal Grandmother    Miscarriages /  Stillbirths Maternal Grandmother    Alcohol abuse Maternal Grandfather    Cancer Maternal Grandfather        Prostate   Cancer Paternal Grandmother        Breast   Hearing loss Paternal Grandmother    Miscarriages / Stillbirths Paternal Grandmother    Cancer Paternal Grandfather        Skin   Bipolar disorder Paternal Grandfather    Social History   Socioeconomic History   Marital status: Single    Spouse name: Not on file   Number of children: Not on file   Years of education: Not on file   Highest education level: Not on file  Occupational History   Not on file  Tobacco Use   Smoking status: Never   Smokeless tobacco: Never  Vaping Use   Vaping status: Never Used  Substance and Sexual  Activity   Alcohol use: Yes    Comment: ocassional   Drug use: Never   Sexual activity: Never  Other Topics Concern   Not on file  Social History Narrative   Not on file   Social Determinants of Health   Financial Resource Strain: Low Risk  (04/07/2023)   Overall Financial Resource Strain (CARDIA)    Difficulty of Paying Living Expenses: Not hard at all  Food Insecurity: No Food Insecurity (04/07/2023)   Hunger Vital Sign    Worried About Running Out of Food in the Last Year: Never true    Ran Out of Food in the Last Year: Never true  Transportation Needs: No Transportation Needs (04/07/2023)   PRAPARE - Administrator, Civil Service (Medical): No    Lack of Transportation (Non-Medical): No  Physical Activity: Insufficiently Active (04/07/2023)   Exercise Vital Sign    Days of Exercise per Week: 2 days    Minutes of Exercise per Session: 60 min  Stress: No Stress Concern Present (04/07/2023)   [employer] of Occupational Health - Occupational Stress Questionnaire    Feeling of Stress : Not at all  Social Connections: Moderately Integrated (04/07/2023)   Social Connection and Isolation Panel [NHANES]    Frequency of Communication with Friends and Family: Three times a week    Frequency of Social Gatherings with Friends and Family: More than three times a week    Attends Religious Services: 1 to 4 times per year    Active Member of [employer] or Organizations: Yes    Attends Banker Meetings: 1 to 4 times per year    Marital Status: Never married    Tobacco Counseling Counseling given: Not Answered   Clinical Intake:  Pre-visit preparation completed: Yes  Pain : No/denies pain     BMI - recorded: 33.19 Nutritional Status: BMI > 30  Obese Nutritional Risks: None Diabetes: No  How often do you need to have someone help you when you read instructions, pamphlets, or other written materials from your doctor or pharmacy?: 1 -  Never  Interpreter Needed?: No  Information entered by :: Lanier Ensign, LPN   Activities of Daily Living    04/07/2023    2:21 PM  In your present state of health, do you have any difficulty performing the following activities:  Hearing? 0  Vision? 0  Difficulty concentrating or making decisions? 0  Walking or climbing stairs? 0  Dressing or bathing? 0  Doing errands, shopping? 0  Preparing Food and eating ? N  Using the Toilet? N  In the past  six months, have you accidently leaked urine? N  Do you have problems with loss of bowel control? N  Managing your Medications? N  Managing your Finances? N  Housekeeping or managing your Housekeeping? N    Patient Care Team: Willow Ora, MD as PCP - General (Family Medicine) Glyn Ade, PA-C as Physician Assistant (Dermatology)  Indicate any recent Medical Services you may have received from other than [facility] providers in the past year (date may be approximate).     Assessment:   This is a routine wellness examination for Aarav.  Hearing/Vision screen Hearing Screening - Comments:: Pt denies any hearing issues  Vision Screening - Comments:: Pt follows up with Dr Cherlynn Polo for annual eye exams    Goals Addressed             This Visit's Progress    Patient Stated       Patient Stated       Lose weight        Depression Screen    04/07/2023    2:24 PM 11/29/2022    9:56 AM 04/26/2022    1:02 PM 03/26/2022    9:34 AM 03/12/2022   11:27 AM  PHQ 2/9 Scores  PHQ - 2 Score 0 0 0 0 0    Fall Risk    04/07/2023    2:26 PM 11/29/2022    9:56 AM 04/26/2022    1:02 PM 03/26/2022    9:37 AM 03/12/2022   11:27 AM  Fall Risk   Falls in the past year? 0 0 0 0 0  Number falls in past yr: 0 0 0 0 0  Injury with Fall? 0 0 0 0 0  Risk for fall due to : No Fall Risks No Fall Risks No Fall Risks Impaired vision No Fall Risks  Follow up Falls prevention discussed Falls evaluation completed Falls evaluation  completed Falls prevention discussed Falls evaluation completed    MEDICARE RISK AT HOME: Medicare Risk at Home Any stairs in or around the home?: Yes If so, are there any without handrails?: No Home free of loose throw rugs in walkways, pet beds, electrical cords, etc?: Yes Adequate lighting in your home to reduce risk of falls?: Yes Life alert?: No Use of a cane, walker or w/c?: No Grab bars in the bathroom?: No Shower chair or bench in shower?: Yes Elevated toilet seat or a handicapped toilet?: No  TIMED UP AND GO:  Was the test performed?  Yes  Length of time to ambulate 10 feet: 10 sec Gait steady and fast without use of assistive device    Cognitive Function:        04/07/2023    2:27 PM 03/26/2022    9:39 AM  6CIT Screen  What Year? 0 points 0 points  What month? 0 points 0 points  What time? 0 points 3 points  Count back from 20 0 points 0 points  Months in reverse 0 points 0 points  Repeat phrase 0 points 0 points  Total Score 0 points 3 points    Immunizations Immunization History  Administered Date(s) Administered   H1N1 04/15/2008   Hepatitis A 12/23/2008, 06/24/2014   Hepatitis A, Adult 12/23/2008, 06/24/2014   Influenza Nasal 02/23/2007   Influenza Split 03/02/2004, 03/04/2009, 01/08/2011, 01/22/2012   Influenza, Seasonal, Injecte, Preservative Fre 05/11/2003, 03/10/2008, 01/22/2012, 02/03/2014   Influenza,inj,Quad PF,6+ Mos 01/19/2015, 01/21/2017, 01/09/2018, 03/20/2018, 01/17/2019, 03/06/2022   Influenza-Unspecified 05/11/2003, 02/03/2014, 01/25/2016, 02/01/2023   PFIZER(Purple Top)SARS-COV-2  Vaccination 07/15/2019, 08/11/2019, 05/04/2020   Td 06/24/2014   Typhoid Inactivated 06/24/2014   Typhoid Live 06/24/2014    TDAP status: Up to date  Flu Vaccine status: Up to date    Covid-19 vaccine status: Information provided on how to obtain vaccines.   Qualifies for Shingles Vaccine? No    Screening Tests Health Maintenance  Topic Date Due    Medicare Annual Wellness (AWV)  04/06/2024   DTaP/Tdap/Td (2 - Tdap) 06/24/2024   INFLUENZA VACCINE  Completed   Hepatitis C Screening  Completed   HIV Screening  Completed   HPV VACCINES  Aged Out   COVID-19 Vaccine  Discontinued    Health Maintenance  There are no preventive care reminders to display for this patient.     Additional Screening:  Hepatitis C Screening:  Completed 10/19/20  Vision Screening: Recommended annual ophthalmology exams for early detection of glaucoma and other disorders of the eye. Is the patient up to date with their annual eye exam?  Yes  Who is the provider or what is the name of the office in which the patient attends annual eye exams? Dr  Cherlynn Polo  If pt is not established with a provider, would they like to be referred to a provider to establish care? No .   Dental Screening: Recommended annual dental exams for proper oral hygiene   Community Resource Referral / Chronic Care Management: CRR required this visit?  No   CCM required this visit?  No     Plan:     I have personally reviewed and noted the following in the patient's chart:   Medical and social history Use of alcohol, tobacco or illicit drugs  Current medications and supplements including opioid prescriptions. Patient is not currently taking opioid prescriptions. Functional ability and status Nutritional status Physical activity Advanced directives List of other physicians Hospitalizations, surgeries, and ER visits in previous 12 months Vitals Screenings to include cognitive, depression, and falls Referrals and appointments  In addition, I have reviewed and discussed with patient certain preventive protocols, quality metrics, and best practice recommendations. A written personalized care plan for preventive services as well as general preventive health recommendations were provided to patient.     Marzella Schlein, LPN   [id number]/[id number]   After Visit Summary: (MyChart) Due  to this being a telephonic visit, the after visit summary with patients personalized plan was offered to patient via MyChart   Nurse Notes: none

## 2023-04-10 ENCOUNTER — Ambulatory Visit: Payer: PPO | Admitting: *Deleted

## 2023-04-10 DIAGNOSIS — J309 Allergic rhinitis, unspecified: Secondary | ICD-10-CM | POA: Diagnosis not present

## 2023-04-15 ENCOUNTER — Ambulatory Visit (INDEPENDENT_AMBULATORY_CARE_PROVIDER_SITE_OTHER): Payer: PPO

## 2023-04-15 DIAGNOSIS — J309 Allergic rhinitis, unspecified: Secondary | ICD-10-CM | POA: Diagnosis not present

## 2023-04-24 ENCOUNTER — Ambulatory Visit: Payer: PPO

## 2023-04-24 DIAGNOSIS — J309 Allergic rhinitis, unspecified: Secondary | ICD-10-CM

## 2023-05-01 ENCOUNTER — Ambulatory Visit: Payer: PPO

## 2023-05-01 DIAGNOSIS — J309 Allergic rhinitis, unspecified: Secondary | ICD-10-CM | POA: Diagnosis not present

## 2023-05-06 ENCOUNTER — Ambulatory Visit (INDEPENDENT_AMBULATORY_CARE_PROVIDER_SITE_OTHER): Payer: PPO

## 2023-05-06 DIAGNOSIS — J309 Allergic rhinitis, unspecified: Secondary | ICD-10-CM

## 2023-05-13 ENCOUNTER — Ambulatory Visit (INDEPENDENT_AMBULATORY_CARE_PROVIDER_SITE_OTHER): Payer: PPO

## 2023-05-13 DIAGNOSIS — J309 Allergic rhinitis, unspecified: Secondary | ICD-10-CM

## 2023-05-20 ENCOUNTER — Ambulatory Visit (INDEPENDENT_AMBULATORY_CARE_PROVIDER_SITE_OTHER): Payer: PPO

## 2023-05-20 DIAGNOSIS — J309 Allergic rhinitis, unspecified: Secondary | ICD-10-CM | POA: Diagnosis not present

## 2023-05-24 ENCOUNTER — Other Ambulatory Visit: Payer: Self-pay | Admitting: Allergy

## 2023-05-29 ENCOUNTER — Ambulatory Visit (INDEPENDENT_AMBULATORY_CARE_PROVIDER_SITE_OTHER): Payer: PPO

## 2023-05-29 DIAGNOSIS — J309 Allergic rhinitis, unspecified: Secondary | ICD-10-CM | POA: Diagnosis not present

## 2023-06-03 ENCOUNTER — Ambulatory Visit (INDEPENDENT_AMBULATORY_CARE_PROVIDER_SITE_OTHER): Payer: PPO

## 2023-06-03 DIAGNOSIS — J309 Allergic rhinitis, unspecified: Secondary | ICD-10-CM

## 2023-06-10 ENCOUNTER — Ambulatory Visit (INDEPENDENT_AMBULATORY_CARE_PROVIDER_SITE_OTHER): Payer: PPO

## 2023-06-10 DIAGNOSIS — M9902 Segmental and somatic dysfunction of thoracic region: Secondary | ICD-10-CM | POA: Diagnosis not present

## 2023-06-10 DIAGNOSIS — M9903 Segmental and somatic dysfunction of lumbar region: Secondary | ICD-10-CM | POA: Diagnosis not present

## 2023-06-10 DIAGNOSIS — M5414 Radiculopathy, thoracic region: Secondary | ICD-10-CM | POA: Diagnosis not present

## 2023-06-10 DIAGNOSIS — J309 Allergic rhinitis, unspecified: Secondary | ICD-10-CM | POA: Diagnosis not present

## 2023-06-10 DIAGNOSIS — M9901 Segmental and somatic dysfunction of cervical region: Secondary | ICD-10-CM | POA: Diagnosis not present

## 2023-06-10 DIAGNOSIS — M531 Cervicobrachial syndrome: Secondary | ICD-10-CM | POA: Diagnosis not present

## 2023-06-10 DIAGNOSIS — M5416 Radiculopathy, lumbar region: Secondary | ICD-10-CM | POA: Diagnosis not present

## 2023-06-19 ENCOUNTER — Ambulatory Visit: Payer: PPO

## 2023-06-19 DIAGNOSIS — J309 Allergic rhinitis, unspecified: Secondary | ICD-10-CM

## 2023-07-01 ENCOUNTER — Ambulatory Visit (INDEPENDENT_AMBULATORY_CARE_PROVIDER_SITE_OTHER): Payer: PPO

## 2023-07-01 DIAGNOSIS — J309 Allergic rhinitis, unspecified: Secondary | ICD-10-CM | POA: Diagnosis not present

## 2023-07-01 DIAGNOSIS — F902 Attention-deficit hyperactivity disorder, combined type: Secondary | ICD-10-CM | POA: Diagnosis not present

## 2023-07-08 ENCOUNTER — Ambulatory Visit (INDEPENDENT_AMBULATORY_CARE_PROVIDER_SITE_OTHER): Payer: PPO

## 2023-07-08 DIAGNOSIS — J309 Allergic rhinitis, unspecified: Secondary | ICD-10-CM | POA: Diagnosis not present

## 2023-07-15 ENCOUNTER — Ambulatory Visit (INDEPENDENT_AMBULATORY_CARE_PROVIDER_SITE_OTHER): Payer: PPO

## 2023-07-15 DIAGNOSIS — J309 Allergic rhinitis, unspecified: Secondary | ICD-10-CM

## 2023-07-22 ENCOUNTER — Ambulatory Visit (INDEPENDENT_AMBULATORY_CARE_PROVIDER_SITE_OTHER)

## 2023-07-22 DIAGNOSIS — J309 Allergic rhinitis, unspecified: Secondary | ICD-10-CM

## 2023-07-29 ENCOUNTER — Ambulatory Visit (INDEPENDENT_AMBULATORY_CARE_PROVIDER_SITE_OTHER)

## 2023-07-29 DIAGNOSIS — J309 Allergic rhinitis, unspecified: Secondary | ICD-10-CM

## 2023-08-05 ENCOUNTER — Ambulatory Visit (INDEPENDENT_AMBULATORY_CARE_PROVIDER_SITE_OTHER)

## 2023-08-05 DIAGNOSIS — J309 Allergic rhinitis, unspecified: Secondary | ICD-10-CM

## 2023-08-12 ENCOUNTER — Ambulatory Visit (INDEPENDENT_AMBULATORY_CARE_PROVIDER_SITE_OTHER)

## 2023-08-12 DIAGNOSIS — J309 Allergic rhinitis, unspecified: Secondary | ICD-10-CM | POA: Diagnosis not present

## 2023-08-19 ENCOUNTER — Ambulatory Visit (INDEPENDENT_AMBULATORY_CARE_PROVIDER_SITE_OTHER)

## 2023-08-19 DIAGNOSIS — J309 Allergic rhinitis, unspecified: Secondary | ICD-10-CM | POA: Diagnosis not present

## 2023-08-28 ENCOUNTER — Other Ambulatory Visit: Payer: Self-pay

## 2023-08-28 MED ORDER — LEVOCETIRIZINE DIHYDROCHLORIDE 5 MG PO TABS: 5.0000 mg | ORAL_TABLET | Freq: Every evening | ORAL | 0 refills | Status: DC

## 2023-08-28 NOTE — Telephone Encounter (Signed)
 Received a rx refill request via fax for the levocetirizine. A refill has been sent in to last until the next appt in May.

## 2023-09-09 ENCOUNTER — Ambulatory Visit (INDEPENDENT_AMBULATORY_CARE_PROVIDER_SITE_OTHER)

## 2023-09-09 DIAGNOSIS — J309 Allergic rhinitis, unspecified: Secondary | ICD-10-CM | POA: Diagnosis not present

## 2023-09-18 ENCOUNTER — Ambulatory Visit

## 2023-09-18 DIAGNOSIS — J309 Allergic rhinitis, unspecified: Secondary | ICD-10-CM

## 2023-09-23 ENCOUNTER — Ambulatory Visit (INDEPENDENT_AMBULATORY_CARE_PROVIDER_SITE_OTHER): Admitting: Family Medicine

## 2023-09-23 ENCOUNTER — Ambulatory Visit: Admitting: Family

## 2023-09-23 ENCOUNTER — Ambulatory Visit: Payer: PPO | Admitting: Allergy

## 2023-09-23 VITALS — BP 126/88 | HR 111 | Temp 97.9°F | Resp 18

## 2023-09-23 DIAGNOSIS — T7800XD Anaphylactic reaction due to unspecified food, subsequent encounter: Secondary | ICD-10-CM | POA: Diagnosis not present

## 2023-09-23 DIAGNOSIS — J3089 Other allergic rhinitis: Secondary | ICD-10-CM | POA: Diagnosis not present

## 2023-09-23 DIAGNOSIS — J309 Allergic rhinitis, unspecified: Secondary | ICD-10-CM

## 2023-09-23 DIAGNOSIS — J4520 Mild intermittent asthma, uncomplicated: Secondary | ICD-10-CM

## 2023-09-23 DIAGNOSIS — T7800XA Anaphylactic reaction due to unspecified food, initial encounter: Secondary | ICD-10-CM

## 2023-09-23 DIAGNOSIS — K219 Gastro-esophageal reflux disease without esophagitis: Secondary | ICD-10-CM | POA: Diagnosis not present

## 2023-09-23 NOTE — Patient Instructions (Signed)
 Asthma Continue albuterol  2 puffs once every 4 hours if needed for cough or wheeze You may use albuterol  2 puffs 5 to 15 minutes before activity to decrease cough or wheeze  Allergic rhinitis Continue allergen avoidance measures directed toward grass pollen, weed pollen, ragweed pollen, mold, dust mite, cat, dog, and cockroach as listed below Continue allergen immunotherapy and have access to an epinephrine  autoinjector set per protocol Continue Xyzal  5 mg once a day if needed for runny nose or itch.  You may take an extra dose of Xyzal  5 mg once a day on the day you receive your allergy  injection Consider saline nasal rinses as needed for nasal symptoms. Use this before any medicated nasal sprays for best result  Reflux Continue dietary and activity modifications as listed below Continue omeprazole  20 mg once a day to control reflux  Food allergy  Continue to avoid shellfish.  In case of an allergic reaction, take Benadryl 50 mg every 4 hours, and if life-threatening symptoms occur, inject with EpiPen  0.3 mg.   Call the clinic if this treatment plan is not working well for you.  Follow up in 6 months or sooner if needed.  Reducing Pollen Exposure The American Academy of Allergy , Asthma and Immunology suggests the following steps to reduce your exposure to pollen during allergy  seasons. Do not hang sheets or clothing out to dry; pollen may collect on these items. Do not mow lawns or spend time around freshly cut grass; mowing stirs up pollen. Keep windows closed at night.  Keep car windows closed while driving. Minimize morning activities outdoors, a time when pollen counts are usually at their highest. Stay indoors as much as possible when pollen counts or humidity is high and on windy days when pollen tends to remain in the air longer. Use air conditioning when possible.  Many air conditioners have filters that trap the pollen spores. Use a HEPA room air filter to remove pollen form  the indoor air you breathe.  Control of Mold Allergen Mold and fungi can grow on a variety of surfaces provided certain temperature and moisture conditions exist.  Outdoor molds grow on plants, decaying vegetation and soil.  The major outdoor mold, Alternaria and Cladosporium, are found in very high numbers during hot and dry conditions.  Generally, a late Summer - Fall peak is seen for common outdoor fungal spores.  Rain will temporarily lower outdoor mold spore count, but counts rise rapidly when the rainy period ends.  The most important indoor molds are Aspergillus and Penicillium.  Dark, humid and poorly ventilated basements are ideal sites for mold growth.  The next most common sites of mold growth are the bathroom and the kitchen.  Outdoor [employer] Use air conditioning and keep windows closed Avoid exposure to decaying vegetation. Avoid leaf raking. Avoid grain handling. Consider wearing a face mask if working in moldy areas.  Indoor Mold Control Maintain humidity below 50%. Clean washable surfaces with 5% bleach solution. Remove sources e.g. Contaminated carpets.   Control of Dust Mite Allergen Dust mites play a major role in allergic asthma and rhinitis. They occur in environments with high humidity wherever human skin is found. Dust mites absorb humidity from the atmosphere (ie, they do not drink) and feed on organic matter (including shed human and animal skin). Dust mites are a microscopic type of insect that you cannot see with the naked eye. High levels of dust mites have been detected from mattresses, pillows, carpets, upholstered furniture, bed covers, clothes,  soft toys and any woven material. The principal allergen of the dust mite is found in its feces. A gram of dust may contain 1,000 mites and 250,000 fecal particles. Mite antigen is easily measured in the air during house cleaning activities. Dust mites do not bite and do not cause harm to humans, other than by triggering  allergies/asthma.  Ways to decrease your exposure to dust mites in your home:  1. Encase mattresses, box springs and pillows with a mite-impermeable barrier or cover  2. Wash sheets, blankets and drapes weekly in hot water (130 F) with detergent and dry them in a dryer on the hot setting.  3. Have the room cleaned frequently with a vacuum cleaner and a damp dust-mop. For carpeting or rugs, vacuuming with a vacuum cleaner equipped with a high-efficiency particulate air (HEPA) filter. The dust mite allergic individual should not be in a room which is being cleaned and should wait 1 hour after cleaning before going into the room.  4. Do not sleep on upholstered furniture (eg, couches).  5. If possible removing carpeting, upholstered furniture and drapery from the home is ideal. Horizontal blinds should be eliminated in the rooms where the person spends the most time (bedroom, study, television room). Washable vinyl, roller-type shades are optimal.  6. Remove all non-washable stuffed toys from the bedroom. Wash stuffed toys weekly like sheets and blankets above.  7. Reduce indoor humidity to less than 50%. Inexpensive humidity monitors can be purchased at most hardware stores. Do not use a humidifier as can make the problem worse and are not recommended.  Control of Dog or Cat Allergen Avoidance is the best way to manage a dog or cat allergy . If you have a dog or cat and are allergic to dog or cats, consider removing the dog or cat from the home. If you have a dog or cat but don't want to find it a new home, or if your family wants a pet even though someone in the household is allergic, here are some strategies that may help keep symptoms at bay:  Keep the pet out of your bedroom and restrict it to only a few rooms. Be advised that keeping the dog or cat in only one room will not limit the allergens to that room. Don't pet, hug or kiss the dog or cat; if you do, wash your hands with soap and  water. High-efficiency particulate air (HEPA) cleaners run continuously in a bedroom or living room can reduce allergen levels over time. Regular use of a high-efficiency vacuum cleaner or a central vacuum can reduce allergen levels. Giving your dog or cat a bath at least once a week can reduce airborne allergen.  Control of Cockroach Allergen Cockroach allergen has been identified as an important cause of acute attacks of asthma, especially in urban settings.  There are fifty-five species of cockroach that exist in the [place] , however only three, the [place], [place] and [place] species produce allergen that can affect patients with Asthma.  Allergens can be obtained from fecal particles, egg casings and secretions from cockroaches.    Remove food sources. Reduce access to water. Seal access and entry points. Spray runways with 0.5-1% Diazinon or Chlorpyrifos Blow boric acid power under stoves and refrigerator. Place bait stations (hydramethylnon) at feeding sites.

## 2023-09-23 NOTE — Progress Notes (Unsigned)
   [address] [place] [address] Dept: [phone]  FOLLOW UP NOTE  Patient ID: Taylor Hines, male    DOB: [birth year]  Age: 38 y.o. MRN: [id number] Date of Office Visit: 09/23/2023  Assessment  Chief Complaint: No chief complaint on file.  HPI [name] is a 38 year old male who presents to the clinic for follow-up visit.  He was last seen in this clinic on 03/25/2023 by Dr. Burdette Carolin for evaluation of asthma, allergic rhinitis on allergen immunotherapy, food sensitivity, and reflux.  Discussed the use of AI scribe software for clinical note transcription with the patient, who gave verbal consent to proceed.  History of Present Illness      Drug Allergies:  Allergies  Allergen Reactions  . Bactrim [Sulfamethoxazole-Trimethoprim] Hives and Swelling  . Multihance  [Gadobenate] Shortness Of Breath, Swelling and Cough    50 mg of benadryl given at 345. Coughing, difficulty swallowing and breathing. Sneezing.  . Nsaids Swelling  . Other Swelling  . Sulfa Antibiotics Hives and Swelling    Eyes "swell shut"  PN: LW Reaction:  . Cat Dander   . Contrast Media [Iodinated Contrast Media]   . Lisinopril  Cough  . Nickel Rash    Physical Exam: There were no vitals taken for this visit.   Physical Exam  Diagnostics:    Assessment and Plan: No diagnosis found.  No orders of the defined types were placed in this encounter.   There are no Patient Instructions on file for this visit.  No follow-ups on file.    Thank you for the opportunity to care for this patient.  Please do not hesitate to contact me with questions.  Marinus Sic, FNP [facility]

## 2023-09-25 ENCOUNTER — Encounter: Payer: Self-pay | Admitting: Family Medicine

## 2023-09-25 DIAGNOSIS — K219 Gastro-esophageal reflux disease without esophagitis: Secondary | ICD-10-CM | POA: Insufficient documentation

## 2023-09-25 DIAGNOSIS — T7800XA Anaphylactic reaction due to unspecified food, initial encounter: Secondary | ICD-10-CM | POA: Insufficient documentation

## 2023-09-25 DIAGNOSIS — T781XXD Other adverse food reactions, not elsewhere classified, subsequent encounter: Secondary | ICD-10-CM | POA: Insufficient documentation

## 2023-09-30 ENCOUNTER — Ambulatory Visit (INDEPENDENT_AMBULATORY_CARE_PROVIDER_SITE_OTHER)

## 2023-09-30 DIAGNOSIS — J309 Allergic rhinitis, unspecified: Secondary | ICD-10-CM

## 2023-10-02 ENCOUNTER — Other Ambulatory Visit: Payer: Self-pay

## 2023-10-02 MED ORDER — LEVOCETIRIZINE DIHYDROCHLORIDE 5 MG PO TABS: 5.0000 mg | ORAL_TABLET | Freq: Every evening | ORAL | 5 refills | Status: AC

## 2023-10-02 NOTE — Telephone Encounter (Signed)
 Received a fax from the pharmacy for refill request of xyzal . Refills have been sent into the pharmacy

## 2023-10-07 ENCOUNTER — Ambulatory Visit (INDEPENDENT_AMBULATORY_CARE_PROVIDER_SITE_OTHER)

## 2023-10-07 DIAGNOSIS — J309 Allergic rhinitis, unspecified: Secondary | ICD-10-CM | POA: Diagnosis not present

## 2023-10-16 ENCOUNTER — Other Ambulatory Visit: Payer: Self-pay

## 2023-10-16 MED ORDER — OMEPRAZOLE MAGNESIUM 20 MG PO TBEC: 20.0000 mg | DELAYED_RELEASE_TABLET | Freq: Every day | ORAL | 5 refills | Status: AC

## 2023-10-23 ENCOUNTER — Ambulatory Visit (INDEPENDENT_AMBULATORY_CARE_PROVIDER_SITE_OTHER)

## 2023-10-23 DIAGNOSIS — J309 Allergic rhinitis, unspecified: Secondary | ICD-10-CM

## 2023-10-28 ENCOUNTER — Telehealth: Payer: Self-pay

## 2023-10-28 ENCOUNTER — Ambulatory Visit (INDEPENDENT_AMBULATORY_CARE_PROVIDER_SITE_OTHER)

## 2023-10-28 DIAGNOSIS — F902 Attention-deficit hyperactivity disorder, combined type: Secondary | ICD-10-CM | POA: Diagnosis not present

## 2023-10-28 DIAGNOSIS — J309 Allergic rhinitis, unspecified: Secondary | ICD-10-CM

## 2023-10-28 MED ORDER — LEVOCETIRIZINE DIHYDROCHLORIDE 5 MG PO TABS: 5.0000 mg | ORAL_TABLET | Freq: Every evening | ORAL | 5 refills | Status: DC

## 2023-10-28 NOTE — Telephone Encounter (Signed)
 Noted

## 2023-10-28 NOTE — Telephone Encounter (Signed)
 Patient is moving to [place]  June 17th.  Patient would like to continue shots and have his vials sent over to an Allergist in [place] . Patient's father will look for a new allergist and make a new patient appointment and have the form filled out by them to start the transfer process. A copy of vial transfer sheet will stay at the [facility] office just in case patient copy is lost during the move.

## 2023-10-28 NOTE — Addendum Note (Signed)
 Addended by: Jackqulyn Masse on: 10/28/2023 11:43 AM   Modules accepted: Orders

## 2023-12-02 NOTE — Telephone Encounter (Signed)
 Patient's father called needing transfer vial forms faxed to their new allergy  office. They lost their copy during the move. Form has been faxed over to :  [facility] [address] [place], [place] [address] Phone: [phone] Fax: [phone]  Provider: Lamar DOROTHA Peru, MD

## 2023-12-04 NOTE — Telephone Encounter (Signed)
 Received the fax for immunotherapy transfer of vials from [facility]. It was signed by Dr. Lazar and patient has been notified via voicemail. Patient's lost forms and must go in to sign the agreement portion for transfer of care. I also called the allergy  office and left a message for their nurse.

## 2023-12-05 NOTE — Telephone Encounter (Signed)
 Patient's new allergy  office faxed over the second side of the form with patient's portion filled out. Please fax over patient's shot records.

## 2023-12-08 DIAGNOSIS — M5418 Radiculopathy, sacral and sacrococcygeal region: Secondary | ICD-10-CM | POA: Diagnosis not present

## 2023-12-08 DIAGNOSIS — M9904 Segmental and somatic dysfunction of sacral region: Secondary | ICD-10-CM | POA: Diagnosis not present

## 2023-12-08 DIAGNOSIS — M9902 Segmental and somatic dysfunction of thoracic region: Secondary | ICD-10-CM | POA: Diagnosis not present

## 2023-12-08 DIAGNOSIS — M5134 Other intervertebral disc degeneration, thoracic region: Secondary | ICD-10-CM | POA: Diagnosis not present

## 2023-12-08 DIAGNOSIS — M7912 Myalgia of auxiliary muscles, head and neck: Secondary | ICD-10-CM | POA: Diagnosis not present

## 2023-12-08 DIAGNOSIS — M9903 Segmental and somatic dysfunction of lumbar region: Secondary | ICD-10-CM | POA: Diagnosis not present

## 2023-12-08 DIAGNOSIS — M51362 Other intervertebral disc degeneration, lumbar region with discogenic back pain and lower extremity pain: Secondary | ICD-10-CM | POA: Diagnosis not present

## 2023-12-08 DIAGNOSIS — M9901 Segmental and somatic dysfunction of cervical region: Secondary | ICD-10-CM | POA: Diagnosis not present

## 2023-12-11 DIAGNOSIS — J301 Allergic rhinitis due to pollen: Secondary | ICD-10-CM | POA: Diagnosis not present

## 2023-12-11 NOTE — Progress Notes (Signed)
 VIALS MADE 12-11-23

## 2023-12-12 DIAGNOSIS — J3089 Other allergic rhinitis: Secondary | ICD-10-CM | POA: Diagnosis not present

## 2023-12-17 ENCOUNTER — Telehealth: Payer: Self-pay

## 2023-12-17 NOTE — Telephone Encounter (Signed)
 I called to inform the patient's father that his vials will be shipped out with new ones on 12/22/23. I left the [facility] number for them to call with any questions or concerns.

## 2023-12-22 DIAGNOSIS — J309 Allergic rhinitis, unspecified: Secondary | ICD-10-CM

## 2023-12-22 NOTE — Telephone Encounter (Signed)
 Patient has been notified that vials are to be mailed out today from the [place] office to their current allergist in [place] .

## 2024-02-12 ENCOUNTER — Encounter: Payer: Self-pay | Admitting: Pharmacist

## 2024-02-12 NOTE — Progress Notes (Signed)
 Pharmacy Quality Measure Review  This patient is appearing on a report for being at risk of failing the adherence measure for hypertension (ACEi/ARB) medications this calendar year.   Medication: losartan  Last fill date: 09/24/2023 for 90 day supply    Patient has moved to [place]  and has new PCP. His Medicare plan has also changed from [employer] to [employer]  No additional intervention needed from our office.   Madelin Ray, PharmD Clinical Pharmacist [facility]  Population Health [phone]

## 2024-06-01 ENCOUNTER — Other Ambulatory Visit: Payer: Self-pay | Admitting: Family Medicine
